# Patient Record
Sex: Male | Born: 1950
Health system: Southern US, Community
[De-identification: ages and names within clinical notes are randomized; demographics above are authoritative.]

## PROBLEM LIST (undated history)

## (undated) DIAGNOSIS — I340 Nonrheumatic mitral (valve) insufficiency: Secondary | ICD-10-CM

## (undated) DIAGNOSIS — E782 Mixed hyperlipidemia: Secondary | ICD-10-CM

## (undated) DIAGNOSIS — I10 Essential (primary) hypertension: Secondary | ICD-10-CM

## (undated) DIAGNOSIS — J302 Other seasonal allergic rhinitis: Secondary | ICD-10-CM

## (undated) DIAGNOSIS — Z9889 Other specified postprocedural states: Secondary | ICD-10-CM

## (undated) DIAGNOSIS — M87052 Idiopathic aseptic necrosis of left femur: Secondary | ICD-10-CM

## (undated) DIAGNOSIS — Z973 Presence of spectacles and contact lenses: Secondary | ICD-10-CM

## (undated) DIAGNOSIS — Z720 Tobacco use: Secondary | ICD-10-CM

## (undated) DIAGNOSIS — K219 Gastro-esophageal reflux disease without esophagitis: Secondary | ICD-10-CM

## (undated) DIAGNOSIS — R002 Palpitations: Secondary | ICD-10-CM

## (undated) DIAGNOSIS — F419 Anxiety disorder, unspecified: Secondary | ICD-10-CM

## (undated) HISTORY — PX: BACK SURGERY: SHX140

## (undated) HISTORY — DX: Mixed hyperlipidemia: E78.2

## (undated) HISTORY — DX: Idiopathic aseptic necrosis of left femur: M87.052

## (undated) HISTORY — DX: Nonrheumatic mitral (valve) insufficiency: I34.0

## (undated) HISTORY — DX: Palpitations: R00.2

## (undated) HISTORY — DX: Other specified postprocedural states: Z98.890

## (undated) HISTORY — DX: Essential (primary) hypertension: I10

## (undated) HISTORY — DX: Other seasonal allergic rhinitis: J30.2

## (undated) HISTORY — DX: Gastro-esophageal reflux disease without esophagitis: K21.9

## (undated) HISTORY — PX: NOSE SURGERY: SHX723

## (undated) HISTORY — DX: Tobacco use: Z72.0

## (undated) HISTORY — DX: Anxiety disorder, unspecified: F41.9

---

## 1999-03-30 ENCOUNTER — Inpatient Hospital Stay (HOSPITAL_COMMUNITY): Admission: EM | Admit: 1999-03-30 | Discharge: 1999-03-31 | Payer: Self-pay | Admitting: Cardiology

## 2007-09-19 ENCOUNTER — Encounter: Payer: Self-pay | Admitting: Physician Assistant

## 2008-01-04 ENCOUNTER — Encounter: Payer: Self-pay | Admitting: Physician Assistant

## 2008-01-04 LAB — PULMONARY FUNCTION TEST

## 2009-03-05 ENCOUNTER — Encounter: Payer: Self-pay | Admitting: Physician Assistant

## 2012-01-06 ENCOUNTER — Other Ambulatory Visit: Payer: Self-pay | Admitting: Diagnostic Neuroimaging

## 2012-01-06 DIAGNOSIS — M79609 Pain in unspecified limb: Secondary | ICD-10-CM

## 2012-01-06 DIAGNOSIS — R209 Unspecified disturbances of skin sensation: Secondary | ICD-10-CM

## 2012-01-18 ENCOUNTER — Encounter: Payer: Self-pay | Admitting: Physician Assistant

## 2012-03-07 ENCOUNTER — Encounter: Payer: Self-pay | Admitting: *Deleted

## 2012-03-12 ENCOUNTER — Ambulatory Visit (INDEPENDENT_AMBULATORY_CARE_PROVIDER_SITE_OTHER): Payer: Medicare Other | Admitting: Cardiology

## 2012-03-12 ENCOUNTER — Encounter: Payer: Self-pay | Admitting: Cardiology

## 2012-03-12 VITALS — BP 150/87 | HR 70 | Ht 69.0 in | Wt 168.0 lb

## 2012-03-12 DIAGNOSIS — Z01818 Encounter for other preprocedural examination: Secondary | ICD-10-CM

## 2012-03-12 DIAGNOSIS — Z720 Tobacco use: Secondary | ICD-10-CM | POA: Insufficient documentation

## 2012-03-12 DIAGNOSIS — E782 Mixed hyperlipidemia: Secondary | ICD-10-CM

## 2012-03-12 DIAGNOSIS — R011 Cardiac murmur, unspecified: Secondary | ICD-10-CM

## 2012-03-12 DIAGNOSIS — F17200 Nicotine dependence, unspecified, uncomplicated: Secondary | ICD-10-CM

## 2012-03-12 DIAGNOSIS — I10 Essential (primary) hypertension: Secondary | ICD-10-CM

## 2012-03-12 DIAGNOSIS — Z136 Encounter for screening for cardiovascular disorders: Secondary | ICD-10-CM

## 2012-03-12 NOTE — Assessment & Plan Note (Signed)
Smoking cessation discussed 

## 2012-03-12 NOTE — Assessment & Plan Note (Signed)
Suspect benign, echocardiogram to be ordered for further evaluation.

## 2012-03-12 NOTE — Patient Instructions (Signed)
Your follow up will be based on your test results.  Your physician has requested that you have an echocardiogram. Echocardiography is a painless test that uses sound waves to create images of your heart. It provides your doctor with information about the size and shape of your heart and how well your heart's chambers and valves are working. This procedure takes approximately one hour. There are no restrictions for this procedure. If the results of your test are normal or stable, you will receive a letter. If they are abnormal, the nurse will contact you by phone.

## 2012-03-12 NOTE — Progress Notes (Signed)
Clinical Summary [name] is a 61 y.o.male referred for cardiology consultation by Dr. Thurston Hole prior to elective left hip replacement, reportedly sometime in June. He is here with his wife today.  History as outlined below. He reports no definite history of CAD status post cardiac catheterization in 2000. Reportedly saw Dr. Myrtis Ser in the past. He does not indicate any active angina, has chronic dyspnea on exertion, NYHA class 2-3 in the setting of ongoing tobacco abuse and reported COPD. This has not progressed.  Followup ECG is reviewed below, normal. He states he is able to go up a flight of steps without stopping, main functional limitation is related to hip pain. He uses a cane.  Also reports long-standing history of heart murmur, does not recall a prior echocardiogram.   Allergies  Allergen Reactions  . Doxycycline   . Penicillins     Current Outpatient Prescriptions  Medication Sig Dispense Refill  . albuterol (PROAIR HFA) 108 (90 BASE) MCG/ACT inhaler Inhale 2 puffs into the lungs every 6 (six) hours as needed.      Marland Kitchen atenolol (TENORMIN) 50 MG tablet Take 1 tablet by mouth Daily.      . cetirizine (ZYRTEC) 10 MG tablet Take 10 mg by mouth daily.      . cyanocobalamin (,VITAMIN B-12,) 1000 MCG/ML injection Inject 1,000 mcg into the muscle every 30 (thirty) days.      . diazepam (VALIUM) 10 MG tablet Take 10 mg by mouth every 6 (six) hours as needed.      Marland Kitchen esomeprazole (NEXIUM) 40 MG capsule Take 40 mg by mouth 2 (two) times daily.       Marland Kitchen lovastatin (MEVACOR) 20 MG tablet Take 20 mg by mouth at bedtime.      Marland Kitchen oxyCODONE-acetaminophen (PERCOCET) 10-325 MG per tablet Take 1 tablet by mouth 4 times daily as needed.        Past Medical History  Diagnosis Date  . Essential hypertension, benign   . Anxiety disorder   . Palpitations   . Seasonal allergies   . GERD (gastroesophageal reflux disease)   . Mixed hyperlipidemia   . History of cardiac catheterization     [place]  2000 - no reported significant CAD    Past Surgical History  Procedure Date  . Back surgery     May 2012  . Nose surgery     1988    Family History  Problem Relation Age of Onset  . Heart failure Mother     Social History [name] reports that he has been smoking Cigarettes.  He has a 25 pack-year smoking history. He has never used smokeless tobacco. [name] reports that he does not drink alcohol.  Review of Systems Has occasional palpitations, reports anxiety. No dizziness or syncope. No reported bleeding problems. Does indicate prior history of peptic ulcer disease. Stable appetite. No orthopnea or PND. Otherwise negative.  Physical Examination Filed Vitals:   03/12/12 0959  BP: 150/87  Pulse: 70   Overweight male in no acute distress. HEENT: Conjunctiva and lids normal, oropharynx clear. Neck: Supple, no elevated JVP or carotid bruits, no thyromegaly. Lungs: Diminished breath sounds, clear to auscultation, nonlabored breathing at rest. Cardiac: Regular rate and rhythm, no S3, 2/6 basal systolic murmur, no pericardial rub. Abdomen: Soft, nontender, bowel sounds present, no guarding or rebound. Extremities: No pitting edema, distal pulses 2+. Skin: Warm and dry. Musculoskeletal: No kyphosis. Neuropsychiatric: Alert and oriented x3, affect grossly appropriate.   ECG Normal  sinus rhythm at 70.    Problem List and Plan

## 2012-03-12 NOTE — Assessment & Plan Note (Signed)
Patient being considered for elective left hip replacement. Cardiac risk factors reviewed as well as prior reported reassuring testing. He does not indicate any angina, ECG is normal, and he has stable dyspnea on exertion with reasonable functional capacity from a cardiac perspective in the setting of orthopedic functional limitations. He does have a soft heart murmur, doubt that this is of major clinical significance, however has never had a prior echocardiogram. This will be arranged, and if he has normal LV function with no major valvular abnormalities, he should be able to proceed with planned surgery at an acceptable perioperative cardiac risk. Continue beta blocker therapy throughout, monitor for any arrhythmias.

## 2012-03-12 NOTE — Assessment & Plan Note (Signed)
Continue present regimen and follow up with Dr. Sherril Croon.

## 2012-03-12 NOTE — Assessment & Plan Note (Signed)
On statin therapy, followed by Dr. Vyas. 

## 2012-03-21 ENCOUNTER — Other Ambulatory Visit (INDEPENDENT_AMBULATORY_CARE_PROVIDER_SITE_OTHER): Payer: Medicare Other

## 2012-03-21 ENCOUNTER — Other Ambulatory Visit: Payer: Self-pay

## 2012-03-21 DIAGNOSIS — R011 Cardiac murmur, unspecified: Secondary | ICD-10-CM

## 2012-03-21 DIAGNOSIS — I059 Rheumatic mitral valve disease, unspecified: Secondary | ICD-10-CM

## 2012-03-21 DIAGNOSIS — Z136 Encounter for screening for cardiovascular disorders: Secondary | ICD-10-CM

## 2012-03-27 ENCOUNTER — Telehealth: Payer: Self-pay | Admitting: *Deleted

## 2012-03-27 ENCOUNTER — Encounter: Payer: Self-pay | Admitting: *Deleted

## 2012-03-27 NOTE — Telephone Encounter (Signed)
Pt notified of results and verbalized understanding  

## 2012-03-27 NOTE — Telephone Encounter (Signed)
Left message to call back on voicemail regarding results.   Report and OV note faxed to Dr. Thurston Hole.

## 2012-03-27 NOTE — Telephone Encounter (Signed)
Message copied by Arlyss Gandy on Tue Mar 27, 2012 11:23 AM ------      Message from: MCDOWELL, Illene Bolus      Created: Wed Mar 21, 2012  1:17 PM       Reviewed report. LVEF is normal at 55-60%. Has some aortic valve thickening but no stenosis. Please send copy of this report and also recent office note to Dr. Thurston Hole. Patient should be able to proceed with planned surgery as noted.

## 2012-07-05 ENCOUNTER — Encounter (HOSPITAL_COMMUNITY): Payer: Self-pay | Admitting: Pharmacy Technician

## 2012-07-16 ENCOUNTER — Other Ambulatory Visit (HOSPITAL_COMMUNITY): Payer: Medicare Other

## 2012-07-23 ENCOUNTER — Encounter (HOSPITAL_COMMUNITY): Admission: RE | Payer: Self-pay | Source: Ambulatory Visit

## 2012-07-23 ENCOUNTER — Ambulatory Visit (HOSPITAL_COMMUNITY): Admission: RE | Admit: 2012-07-23 | Payer: Medicare Other | Source: Ambulatory Visit | Admitting: Orthopedic Surgery

## 2012-07-23 SURGERY — ARTHROPLASTY, HIP, TOTAL,POSTERIOR APPROACH
Anesthesia: General | Laterality: Left

## 2013-04-23 ENCOUNTER — Other Ambulatory Visit: Payer: Self-pay | Admitting: Physician Assistant

## 2013-04-23 ENCOUNTER — Encounter: Payer: Self-pay | Admitting: Physician Assistant

## 2013-04-23 DIAGNOSIS — Z9889 Other specified postprocedural states: Secondary | ICD-10-CM | POA: Insufficient documentation

## 2013-04-23 DIAGNOSIS — M87052 Idiopathic aseptic necrosis of left femur: Secondary | ICD-10-CM

## 2013-04-23 DIAGNOSIS — J302 Other seasonal allergic rhinitis: Secondary | ICD-10-CM

## 2013-04-23 DIAGNOSIS — K219 Gastro-esophageal reflux disease without esophagitis: Secondary | ICD-10-CM

## 2013-04-23 DIAGNOSIS — R002 Palpitations: Secondary | ICD-10-CM | POA: Insufficient documentation

## 2013-04-23 DIAGNOSIS — F419 Anxiety disorder, unspecified: Secondary | ICD-10-CM | POA: Insufficient documentation

## 2013-04-29 ENCOUNTER — Encounter (HOSPITAL_COMMUNITY): Admission: RE | Admit: 2013-04-29 | Payer: Medicare Other | Source: Ambulatory Visit

## 2013-05-08 ENCOUNTER — Inpatient Hospital Stay (HOSPITAL_COMMUNITY): Admission: RE | Admit: 2013-05-08 | Payer: Medicare Other | Source: Ambulatory Visit | Admitting: Orthopedic Surgery

## 2013-05-08 ENCOUNTER — Encounter (HOSPITAL_COMMUNITY): Admission: RE | Payer: Self-pay | Source: Ambulatory Visit

## 2013-05-08 SURGERY — ARTHROPLASTY, HIP, TOTAL,POSTERIOR APPROACH
Anesthesia: General | Laterality: Left

## 2013-05-30 ENCOUNTER — Ambulatory Visit: Payer: Medicare Other | Admitting: Physician Assistant

## 2013-06-12 ENCOUNTER — Ambulatory Visit: Payer: Medicare Other | Admitting: Physician Assistant

## 2013-06-18 ENCOUNTER — Encounter (HOSPITAL_COMMUNITY): Payer: Self-pay | Admitting: Pharmacy Technician

## 2013-06-19 ENCOUNTER — Other Ambulatory Visit: Payer: Self-pay | Admitting: Physician Assistant

## 2013-06-19 NOTE — H&P (Signed)
TOTAL HIP ADMISSION H&P  Patient is admitted for left total hip arthroplasty.  Subjective:  Chief Complaint: left hip pain  HPI: Todd Hodge, 62 y.o. male, has a history of pain and functional disability in the left hip(s) due to arthritis and patient has failed non-surgical conservative treatments for greater than 12 weeks to include NSAID's and/or analgesics, corticosteriod injections, flexibility and strengthening excercises, supervised PT with diminished ADL's post treatment, use of assistive devices, weight reduction as appropriate and activity modification.  Onset of symptoms was gradual starting 8 years ago with gradually worsening course since that time.The patient noted no past surgery on the left hip(s).  Patient currently rates pain in the left hip at 8 out of 10 with activity. Patient has night pain, worsening of pain with activity and weight bearing, trendelenberg gait, pain that interfers with activities of daily living and joint swelling. Patient has evidence of subchondral cysts, subchondral sclerosis, periarticular osteophytes and joint space narrowing by imaging studies. This condition presents safety issues increasing the risk of falls. MRI shows AVN of both hips  There is no current active infection.  Patient Active Problem List   Diagnosis Date Noted  . Anxiety disorder   . Palpitations   . Seasonal allergies   . GERD (gastroesophageal reflux disease)   . History of cardiac catheterization   . Avascular necrosis of femur head, left   . Preoperative evaluation to rule out surgical contraindication 03/12/2012  . Cardiac murmur 03/12/2012  . Essential hypertension, benign 03/12/2012  . Tobacco abuse 03/12/2012  . Mixed hyperlipidemia 03/12/2012   Past Medical History  Diagnosis Date  . Essential hypertension, benign   . Anxiety disorder   . Palpitations   . Seasonal allergies   . GERD (gastroesophageal reflux disease)   . Mixed hyperlipidemia   . History of  cardiac catheterization     [place] 2000 - no reported significant CAD  . Avascular necrosis of femur head, left     Past Surgical History  Procedure Laterality Date  . Back surgery      May 2012  . Nose surgery      1988     (Not in a hospital admission) Allergies  Allergen Reactions  . Penicillins   . Doxycycline Rash    History  Substance Use Topics  . Smoking status: Current Every Day Smoker -- 1.00 packs/day for 25 years    Types: Cigarettes  . Smokeless tobacco: Never Used  . Alcohol Use: No    Family History  Problem Relation Age of Onset  . Heart failure Mother      Review of Systems  Constitutional: Negative.   HENT: Negative.   Eyes: Negative.   Respiratory: Positive for shortness of breath.   Gastrointestinal: Negative.   Genitourinary: Negative.   Musculoskeletal: Positive for back pain and joint pain.  Skin: Negative.   Neurological: Negative.   Endo/Heme/Allergies: Negative.   Psychiatric/Behavioral: Negative.     Objective:  Physical Exam  Constitutional: He is oriented to person, place, and time. He appears well-developed and well-nourished.  HENT:  Head: Normocephalic.  Eyes: Conjunctivae are normal. Pupils are equal, round, and reactive to light.  Neck: Neck supple.  Cardiovascular: Normal rate and regular rhythm.   Murmur heard. Respiratory: Effort normal. He has wheezes.  GI: Soft. Bowel sounds are normal.  Genitourinary:  Not pertinent to current symptomatology therefore not examined.  Musculoskeletal:  Well developed well nourished white male in no acute distress. Alert and oriented.  Examination of his left hip reveals severe groin pain, flexion of 90 extension to 0, internal and external rotation of 10 degrees with pain. Exam of the right hip reveals full range of motion with only mild pain. Vascular exam: pulses 2+ and symmetric.  Neurological: He is alert and oriented to person, place, and time.  Skin: Skin is warm and dry.   Psychiatric: He has a normal mood and affect. His behavior is normal.    Vital signs in last 24 hours: Last recorded: 07/23 1500   BP: 166/94 Pulse: 60  Temp: 98.1 F (36.7 C)    Height: 5\' 10"  (1.778 m) SpO2: 98  Weight: 73.936 kg (163 lb)     Labs:   Estimated body mass index is 23.39 kg/(m^2) as calculated from the following:   Height as of this encounter: 5\' 10"  (1.778 m).   Weight as of this encounter: 73.936 kg (163 lb).   Imaging Review Plain radiographs demonstrate severe degenerative joint disease of the left hip(s). The bone quality appears to be good for age and reported activity level.  Assessment/Plan:  End stage arthritis, left hip(s)  The patient history, physical examination, clinical judgement of the provider and imaging studies are consistent with end stage degenerative joint disease of the left hip(s) and total hip arthroplasty is deemed medically necessary. The treatment options including medical management, injection therapy, arthroscopy and arthroplasty were discussed at length. The risks and benefits of total hip arthroplasty were presented and reviewed. The risks due to aseptic loosening, infection, stiffness, dislocation/subluxation,  thromboembolic complications and other imponderables were discussed.  The patient acknowledged the explanation, agreed to proceed with the plan and consent was signed. Patient is being admitted for inpatient treatment for surgery, pain control, PT, OT, prophylactic antibiotics, VTE prophylaxis, progressive ambulation and ADL's and discharge planning.The patient is planning to be discharged home with home health services  Sanjna Haskew A. Gwinda Passe Physician Assistant [facility] [phone]  06/19/2013, 4:34 PM

## 2013-06-25 ENCOUNTER — Encounter (HOSPITAL_COMMUNITY): Admission: RE | Admit: 2013-06-25 | Payer: Medicare Other | Source: Ambulatory Visit

## 2013-06-28 ENCOUNTER — Ambulatory Visit (INDEPENDENT_AMBULATORY_CARE_PROVIDER_SITE_OTHER): Payer: Medicare Other | Admitting: Physician Assistant

## 2013-06-28 ENCOUNTER — Encounter: Payer: Self-pay | Admitting: Physician Assistant

## 2013-06-28 VITALS — BP 123/77 | HR 66 | Ht 70.0 in | Wt 162.8 lb

## 2013-06-28 DIAGNOSIS — R002 Palpitations: Secondary | ICD-10-CM

## 2013-06-28 DIAGNOSIS — I059 Rheumatic mitral valve disease, unspecified: Secondary | ICD-10-CM

## 2013-06-28 DIAGNOSIS — Z01818 Encounter for other preprocedural examination: Secondary | ICD-10-CM

## 2013-06-28 DIAGNOSIS — I340 Nonrheumatic mitral (valve) insufficiency: Secondary | ICD-10-CM

## 2013-06-28 DIAGNOSIS — F17200 Nicotine dependence, unspecified, uncomplicated: Secondary | ICD-10-CM

## 2013-06-28 DIAGNOSIS — Z720 Tobacco use: Secondary | ICD-10-CM

## 2013-06-28 MED ORDER — ASPIRIN EC 81 MG PO TBEC: 81.0000 mg | DELAYED_RELEASE_TABLET | Freq: Every day | ORAL | Status: AC

## 2013-06-28 NOTE — Assessment & Plan Note (Signed)
Patient was strongly advised to stop smoking altogether.

## 2013-06-28 NOTE — Assessment & Plan Note (Signed)
In the absence of any interim development of symptoms suggestive of angina, a repeat NL EKG today, and with normal LVF by echocardiography at time of last OV, patient is once again cleared to proceed with scheduled hip surgery, as planned. He is deemed to be at an acceptable risk for perioperative events, from a cardiac standpoint. We will, however, resume antiplatelet therapy with ASA 81 mg daily, which he had been on in the past. He is to continue on beta blocker therapy, as well.

## 2013-06-28 NOTE — Assessment & Plan Note (Signed)
Assessed as mild, by echocardiogram in April 2013. Continue to monitor clinically.

## 2013-06-28 NOTE — Progress Notes (Signed)
Primary Cardiologist: Simona Huh, MD   HPI: Patient presents for preoperative cardiac clearance, last seen here in clinic in April 2013, by Dr. Diona Browner. Of note, he presented at that time for preoperative cardiac clearance as well. He was referred for an echocardiogram, absent history of significant CAD or presentation with symptoms suggestive of angina pectoris.   - Echocardiogram, 02/2012: EF 55-60%; no focal WMAs; mild MR  He returns today stating that he had to postpone his left hip surgery scheduled for last year, secondary to epidural treatment for recurrent lower back pain, extraction of a possibly infected molar, and current antiviral therapy for herpes zoster. He states that he has to be off Acyclovir for 6-8 weeks, before undergoing hip surgery. Consequently, this has been scheduled for October 6, at [facility], with Dr. Salvatore Marvel.   From a cardiac standpoint, however, he denies any interim development of chest pain, either at rest or with exertion. He also reports no significant change from his baseline exercise tolerance level, although he is quite limited secondary to severe left hip pain.   Twelve-lead EKG today, reviewed by me, indicates NSR 66 bpm; NL axis; no ischemic changes  Allergies  Allergen Reactions  . Penicillins   . Doxycycline Rash    Current Outpatient Prescriptions  Medication Sig Dispense Refill  . albuterol (PROAIR HFA) 108 (90 BASE) MCG/ACT inhaler Inhale 2 puffs into the lungs every 6 (six) hours as needed. For COPD symptoms      . atenolol (TENORMIN) 50 MG tablet Take 1 tablet by mouth Daily.      . cetirizine (ZYRTEC) 10 MG tablet Take 10 mg by mouth at bedtime.       . diazepam (VALIUM) 10 MG tablet Take 10 mg by mouth 4 (four) times daily. For anxiety      . esomeprazole (NEXIUM) 40 MG capsule Take 40 mg by mouth 2 (two) times daily.       Marland Kitchen lovastatin (MEVACOR) 20 MG tablet Take 20 mg by mouth at bedtime.      Marland Kitchen oxyCODONE-acetaminophen  (PERCOCET) 10-325 MG per tablet Take 1 tablet by mouth 4 times daily as needed. For pain      . vitamin B-12 (CYANOCOBALAMIN) 1000 MCG tablet Take 1,000 mcg by mouth daily.      Marland Kitchen aspirin EC 81 MG tablet Take 1 tablet (81 mg total) by mouth daily.       No current facility-administered medications for this visit.    Past Medical History  Diagnosis Date  . Essential hypertension, benign   . Anxiety disorder   . Palpitations   . Seasonal allergies   . GERD (gastroesophageal reflux disease)   . Mixed hyperlipidemia   . History of cardiac catheterization     [place] 2000 - no reported significant CAD  . Avascular necrosis of femur head, left   . Mitral regurgitation     Mild, echo, 02/2012  . Tobacco abuse     Past Surgical History  Procedure Laterality Date  . Back surgery      May 2012  . Nose surgery      1988    History   Social History  . Marital Status: Married    Spouse Name: N/A    Number of Children: N/A  . Years of Education: N/A   Occupational History  . Disabled     Sales   Social History Main Topics  . Smoking status: Current Every Day Smoker -- 1.00 packs/day  for 25 years    Types: Cigarettes  . Smokeless tobacco: Never Used  . Alcohol Use: No  . Drug Use: No  . Sexually Active: Not on file   Other Topics Concern  . Not on file   Social History Narrative  . No narrative on file    Family History  Problem Relation Age of Onset  . Heart failure Mother     ROS: no nausea, vomiting; no fever, chills; no melena, hematochezia; no claudication  PHYSICAL EXAM: BP 123/77  Pulse 66  Ht 5\' 10"  (1.778 m)  Wt 162 lb 12.8 oz (73.846 kg)  BMI 23.36 kg/m2 GENERAL: 62 year old male; NAD HEENT: NCAT, PERRLA, EOMI; sclera clear; no xanthelasma NECK: palpable bilateral carotid pulses, no bruits; no JVD; no TM LUNGS: CTA bilaterally CARDIAC: RRR (S1, S2); no significant murmurs; no rubs or gallops ABDOMEN: soft, non-tender; intact BS EXTREMETIES: no  significant peripheral edema SKIN: warm/dry; no obvious rash/lesions MUSCULOSKELETAL: no joint deformity NEURO: no focal deficit; NL affect   EKG: reviewed and available in Electronic Records   ASSESSMENT & PLAN:  Preoperative evaluation to rule out surgical contraindication In the absence of any interim development of symptoms suggestive of angina, a repeat NL EKG today, and with normal LVF by echocardiography at time of last OV, patient is once again cleared to proceed with scheduled hip surgery, as planned. He is deemed to be at an acceptable risk for perioperative events, from a cardiac standpoint. We will, however, resume antiplatelet therapy with ASA 81 mg daily, which he had been on in the past. He is to continue on beta blocker therapy, as well.  Mitral regurgitation Assessed as mild, by echocardiogram in April 2013. Continue to monitor clinically.  Tobacco abuse Patient was strongly advised to stop smoking altogether.    Gene Charlaine Utsey, PAC

## 2013-06-28 NOTE — Patient Instructions (Signed)
   Begin Aspirin 81mg  daily Continue all other current medications. Your physician wants you to follow up in:  1 year.  You will receive a reminder letter in the mail one-two months in advance.  If you don't receive a letter, please call our office to schedule the follow up appointment

## 2013-07-01 ENCOUNTER — Encounter (HOSPITAL_COMMUNITY): Admission: RE | Payer: Self-pay | Source: Ambulatory Visit

## 2013-07-01 ENCOUNTER — Inpatient Hospital Stay (HOSPITAL_COMMUNITY): Admission: RE | Admit: 2013-07-01 | Payer: Medicare Other | Source: Ambulatory Visit | Admitting: Orthopedic Surgery

## 2013-07-01 SURGERY — ARTHROPLASTY, HIP, TOTAL,POSTERIOR APPROACH
Anesthesia: General | Laterality: Left

## 2013-08-23 ENCOUNTER — Other Ambulatory Visit (HOSPITAL_COMMUNITY): Payer: Medicare Other

## 2013-09-02 ENCOUNTER — Inpatient Hospital Stay: Admit: 2013-09-02 | Payer: Medicare Other | Admitting: Orthopedic Surgery

## 2013-09-02 SURGERY — ARTHROPLASTY, HIP, TOTAL,POSTERIOR APPROACH
Anesthesia: General | Site: Hip | Laterality: Left

## 2013-09-10 ENCOUNTER — Other Ambulatory Visit (HOSPITAL_COMMUNITY): Payer: Self-pay | Admitting: Neurosurgery

## 2013-09-10 DIAGNOSIS — IMO0002 Reserved for concepts with insufficient information to code with codable children: Secondary | ICD-10-CM

## 2013-09-17 ENCOUNTER — Ambulatory Visit (HOSPITAL_COMMUNITY)
Admission: RE | Admit: 2013-09-17 | Discharge: 2013-09-17 | Disposition: A | Discharge status: Home / Self Care | Payer: Medicare Other | Source: Ambulatory Visit | Attending: Neurosurgery | Admitting: Neurosurgery

## 2013-09-17 DIAGNOSIS — IMO0002 Reserved for concepts with insufficient information to code with codable children: Secondary | ICD-10-CM

## 2013-09-17 DIAGNOSIS — M79609 Pain in unspecified limb: Secondary | ICD-10-CM | POA: Insufficient documentation

## 2013-09-17 DIAGNOSIS — M549 Dorsalgia, unspecified: Secondary | ICD-10-CM | POA: Insufficient documentation

## 2013-09-17 LAB — CREATININE, SERUM
Creatinine, Ser: 1.12 mg/dL (ref 0.50–1.35)
GFR calc Af Amer: 78 mL/min — ABNORMAL LOW (ref >90)
GFR calc non Af Amer: 68 mL/min — ABNORMAL LOW (ref >90)

## 2013-09-17 MED ORDER — GADOBENATE DIMEGLUMINE 529 MG/ML IV SOLN
15.0000 mL | Freq: Once | INTRAVENOUS | Status: AC
  Administered 2013-09-17: 15 mL via INTRAVENOUS

## 2013-09-20 ENCOUNTER — Other Ambulatory Visit: Payer: Self-pay | Admitting: Neurosurgery

## 2013-10-14 ENCOUNTER — Inpatient Hospital Stay (HOSPITAL_COMMUNITY): Admission: RE | Admit: 2013-10-14 | Payer: Medicare Other | Source: Ambulatory Visit | Admitting: Neurosurgery

## 2013-10-14 ENCOUNTER — Encounter (HOSPITAL_COMMUNITY): Admission: RE | Payer: Self-pay | Source: Ambulatory Visit

## 2013-10-14 SURGERY — POSTERIOR LUMBAR FUSION 1 LEVEL
Anesthesia: General | Site: Back

## 2015-12-04 DIAGNOSIS — Z418 Encounter for other procedures for purposes other than remedying health state: Secondary | ICD-10-CM | POA: Diagnosis not present

## 2015-12-04 DIAGNOSIS — J441 Chronic obstructive pulmonary disease with (acute) exacerbation: Secondary | ICD-10-CM | POA: Diagnosis not present

## 2015-12-04 DIAGNOSIS — I10 Essential (primary) hypertension: Secondary | ICD-10-CM | POA: Diagnosis not present

## 2015-12-04 DIAGNOSIS — Z720 Tobacco use: Secondary | ICD-10-CM | POA: Diagnosis not present

## 2015-12-10 DIAGNOSIS — F411 Generalized anxiety disorder: Secondary | ICD-10-CM | POA: Diagnosis not present

## 2015-12-10 DIAGNOSIS — E039 Hypothyroidism, unspecified: Secondary | ICD-10-CM | POA: Diagnosis not present

## 2015-12-10 DIAGNOSIS — Z79899 Other long term (current) drug therapy: Secondary | ICD-10-CM | POA: Diagnosis not present

## 2015-12-10 DIAGNOSIS — N400 Enlarged prostate without lower urinary tract symptoms: Secondary | ICD-10-CM | POA: Diagnosis not present

## 2015-12-10 DIAGNOSIS — E7800 Pure hypercholesterolemia, unspecified: Secondary | ICD-10-CM | POA: Diagnosis not present

## 2016-01-05 DIAGNOSIS — M25552 Pain in left hip: Secondary | ICD-10-CM | POA: Diagnosis not present

## 2016-01-05 DIAGNOSIS — N183 Chronic kidney disease, stage 3 (moderate): Secondary | ICD-10-CM | POA: Diagnosis not present

## 2016-01-07 DIAGNOSIS — M25552 Pain in left hip: Secondary | ICD-10-CM | POA: Diagnosis not present

## 2016-01-27 DIAGNOSIS — M545 Low back pain: Secondary | ICD-10-CM | POA: Diagnosis not present

## 2016-01-27 DIAGNOSIS — E7800 Pure hypercholesterolemia, unspecified: Secondary | ICD-10-CM | POA: Diagnosis not present

## 2016-01-27 DIAGNOSIS — I10 Essential (primary) hypertension: Secondary | ICD-10-CM | POA: Diagnosis not present

## 2016-03-28 DIAGNOSIS — E039 Hypothyroidism, unspecified: Secondary | ICD-10-CM | POA: Diagnosis not present

## 2016-03-28 DIAGNOSIS — F411 Generalized anxiety disorder: Secondary | ICD-10-CM | POA: Diagnosis not present

## 2016-03-28 DIAGNOSIS — J449 Chronic obstructive pulmonary disease, unspecified: Secondary | ICD-10-CM | POA: Diagnosis not present

## 2016-03-28 DIAGNOSIS — M545 Low back pain: Secondary | ICD-10-CM | POA: Diagnosis not present

## 2016-04-26 DIAGNOSIS — I10 Essential (primary) hypertension: Secondary | ICD-10-CM | POA: Diagnosis not present

## 2016-04-26 DIAGNOSIS — E7800 Pure hypercholesterolemia, unspecified: Secondary | ICD-10-CM | POA: Diagnosis not present

## 2016-04-26 DIAGNOSIS — J449 Chronic obstructive pulmonary disease, unspecified: Secondary | ICD-10-CM | POA: Diagnosis not present

## 2016-04-26 DIAGNOSIS — M159 Polyosteoarthritis, unspecified: Secondary | ICD-10-CM | POA: Diagnosis not present

## 2016-05-27 DIAGNOSIS — N183 Chronic kidney disease, stage 3 (moderate): Secondary | ICD-10-CM | POA: Diagnosis not present

## 2016-05-27 DIAGNOSIS — Z299 Encounter for prophylactic measures, unspecified: Secondary | ICD-10-CM | POA: Diagnosis not present

## 2016-05-27 DIAGNOSIS — J441 Chronic obstructive pulmonary disease with (acute) exacerbation: Secondary | ICD-10-CM | POA: Diagnosis not present

## 2016-05-27 DIAGNOSIS — M545 Low back pain: Secondary | ICD-10-CM | POA: Diagnosis not present

## 2016-07-27 DIAGNOSIS — Z299 Encounter for prophylactic measures, unspecified: Secondary | ICD-10-CM | POA: Diagnosis not present

## 2016-07-27 DIAGNOSIS — M25559 Pain in unspecified hip: Secondary | ICD-10-CM | POA: Diagnosis not present

## 2016-07-27 DIAGNOSIS — J449 Chronic obstructive pulmonary disease, unspecified: Secondary | ICD-10-CM | POA: Diagnosis not present

## 2016-07-27 DIAGNOSIS — F411 Generalized anxiety disorder: Secondary | ICD-10-CM | POA: Diagnosis not present

## 2016-08-09 DIAGNOSIS — L820 Inflamed seborrheic keratosis: Secondary | ICD-10-CM | POA: Diagnosis not present

## 2016-08-09 DIAGNOSIS — L821 Other seborrheic keratosis: Secondary | ICD-10-CM | POA: Diagnosis not present

## 2016-08-09 DIAGNOSIS — D485 Neoplasm of uncertain behavior of skin: Secondary | ICD-10-CM | POA: Diagnosis not present

## 2016-09-15 DIAGNOSIS — E7800 Pure hypercholesterolemia, unspecified: Secondary | ICD-10-CM | POA: Diagnosis not present

## 2016-09-15 DIAGNOSIS — M159 Polyosteoarthritis, unspecified: Secondary | ICD-10-CM | POA: Diagnosis not present

## 2016-09-15 DIAGNOSIS — I10 Essential (primary) hypertension: Secondary | ICD-10-CM | POA: Diagnosis not present

## 2016-09-15 DIAGNOSIS — J449 Chronic obstructive pulmonary disease, unspecified: Secondary | ICD-10-CM | POA: Diagnosis not present

## 2016-09-26 DIAGNOSIS — M545 Low back pain: Secondary | ICD-10-CM | POA: Diagnosis not present

## 2016-09-26 DIAGNOSIS — Z299 Encounter for prophylactic measures, unspecified: Secondary | ICD-10-CM | POA: Diagnosis not present

## 2016-09-26 DIAGNOSIS — M25559 Pain in unspecified hip: Secondary | ICD-10-CM | POA: Diagnosis not present

## 2016-09-26 DIAGNOSIS — R109 Unspecified abdominal pain: Secondary | ICD-10-CM | POA: Diagnosis not present

## 2016-09-26 DIAGNOSIS — R140 Abdominal distension (gaseous): Secondary | ICD-10-CM | POA: Diagnosis not present

## 2016-09-26 DIAGNOSIS — K219 Gastro-esophageal reflux disease without esophagitis: Secondary | ICD-10-CM | POA: Diagnosis not present

## 2016-09-28 HISTORY — PX: COLONOSCOPY: SHX174

## 2016-09-29 DIAGNOSIS — J449 Chronic obstructive pulmonary disease, unspecified: Secondary | ICD-10-CM | POA: Diagnosis not present

## 2016-09-29 DIAGNOSIS — Z880 Allergy status to penicillin: Secondary | ICD-10-CM | POA: Diagnosis not present

## 2016-09-29 DIAGNOSIS — D125 Benign neoplasm of sigmoid colon: Secondary | ICD-10-CM | POA: Diagnosis not present

## 2016-09-29 DIAGNOSIS — F17210 Nicotine dependence, cigarettes, uncomplicated: Secondary | ICD-10-CM | POA: Diagnosis not present

## 2016-09-29 DIAGNOSIS — Z8673 Personal history of transient ischemic attack (TIA), and cerebral infarction without residual deficits: Secondary | ICD-10-CM | POA: Diagnosis not present

## 2016-09-29 DIAGNOSIS — F419 Anxiety disorder, unspecified: Secondary | ICD-10-CM | POA: Diagnosis not present

## 2016-09-29 DIAGNOSIS — D126 Benign neoplasm of colon, unspecified: Secondary | ICD-10-CM | POA: Diagnosis not present

## 2016-09-29 DIAGNOSIS — Z1211 Encounter for screening for malignant neoplasm of colon: Secondary | ICD-10-CM | POA: Diagnosis not present

## 2016-09-29 DIAGNOSIS — K219 Gastro-esophageal reflux disease without esophagitis: Secondary | ICD-10-CM | POA: Diagnosis not present

## 2016-09-29 DIAGNOSIS — D124 Benign neoplasm of descending colon: Secondary | ICD-10-CM | POA: Diagnosis not present

## 2016-09-29 DIAGNOSIS — I10 Essential (primary) hypertension: Secondary | ICD-10-CM | POA: Diagnosis not present

## 2016-09-29 DIAGNOSIS — K635 Polyp of colon: Secondary | ICD-10-CM | POA: Diagnosis not present

## 2016-09-29 DIAGNOSIS — D123 Benign neoplasm of transverse colon: Secondary | ICD-10-CM | POA: Diagnosis not present

## 2016-09-29 DIAGNOSIS — Z79899 Other long term (current) drug therapy: Secondary | ICD-10-CM | POA: Diagnosis not present

## 2016-09-29 DIAGNOSIS — Z881 Allergy status to other antibiotic agents status: Secondary | ICD-10-CM | POA: Diagnosis not present

## 2016-09-29 DIAGNOSIS — E7800 Pure hypercholesterolemia, unspecified: Secondary | ICD-10-CM | POA: Diagnosis not present

## 2016-10-14 DIAGNOSIS — Z299 Encounter for prophylactic measures, unspecified: Secondary | ICD-10-CM | POA: Diagnosis not present

## 2016-10-14 DIAGNOSIS — J449 Chronic obstructive pulmonary disease, unspecified: Secondary | ICD-10-CM | POA: Diagnosis not present

## 2016-10-14 DIAGNOSIS — R109 Unspecified abdominal pain: Secondary | ICD-10-CM | POA: Diagnosis not present

## 2016-10-14 DIAGNOSIS — I10 Essential (primary) hypertension: Secondary | ICD-10-CM | POA: Diagnosis not present

## 2016-10-14 DIAGNOSIS — F411 Generalized anxiety disorder: Secondary | ICD-10-CM | POA: Diagnosis not present

## 2016-10-28 DIAGNOSIS — M5136 Other intervertebral disc degeneration, lumbar region: Secondary | ICD-10-CM | POA: Diagnosis not present

## 2016-10-28 DIAGNOSIS — K449 Diaphragmatic hernia without obstruction or gangrene: Secondary | ICD-10-CM | POA: Diagnosis not present

## 2016-10-28 DIAGNOSIS — I700 Atherosclerosis of aorta: Secondary | ICD-10-CM | POA: Diagnosis not present

## 2016-10-28 DIAGNOSIS — M879 Osteonecrosis, unspecified: Secondary | ICD-10-CM | POA: Diagnosis not present

## 2016-10-28 DIAGNOSIS — K5730 Diverticulosis of large intestine without perforation or abscess without bleeding: Secondary | ICD-10-CM | POA: Diagnosis not present

## 2016-10-28 DIAGNOSIS — N200 Calculus of kidney: Secondary | ICD-10-CM | POA: Diagnosis not present

## 2016-10-28 DIAGNOSIS — R109 Unspecified abdominal pain: Secondary | ICD-10-CM | POA: Diagnosis not present

## 2016-11-25 DIAGNOSIS — Z6827 Body mass index (BMI) 27.0-27.9, adult: Secondary | ICD-10-CM | POA: Diagnosis not present

## 2016-11-25 DIAGNOSIS — M545 Low back pain: Secondary | ICD-10-CM | POA: Diagnosis not present

## 2016-11-25 DIAGNOSIS — F411 Generalized anxiety disorder: Secondary | ICD-10-CM | POA: Diagnosis not present

## 2016-11-25 DIAGNOSIS — J441 Chronic obstructive pulmonary disease with (acute) exacerbation: Secondary | ICD-10-CM | POA: Diagnosis not present

## 2016-11-25 DIAGNOSIS — Z299 Encounter for prophylactic measures, unspecified: Secondary | ICD-10-CM | POA: Diagnosis not present

## 2016-12-01 DIAGNOSIS — J449 Chronic obstructive pulmonary disease, unspecified: Secondary | ICD-10-CM | POA: Diagnosis not present

## 2016-12-01 DIAGNOSIS — I10 Essential (primary) hypertension: Secondary | ICD-10-CM | POA: Diagnosis not present

## 2016-12-01 DIAGNOSIS — E7800 Pure hypercholesterolemia, unspecified: Secondary | ICD-10-CM | POA: Diagnosis not present

## 2016-12-01 DIAGNOSIS — M159 Polyosteoarthritis, unspecified: Secondary | ICD-10-CM | POA: Diagnosis not present

## 2016-12-07 DIAGNOSIS — Z1389 Encounter for screening for other disorder: Secondary | ICD-10-CM | POA: Diagnosis not present

## 2016-12-07 DIAGNOSIS — Z0000 Encounter for general adult medical examination without abnormal findings: Secondary | ICD-10-CM | POA: Diagnosis not present

## 2016-12-07 DIAGNOSIS — F17200 Nicotine dependence, unspecified, uncomplicated: Secondary | ICD-10-CM | POA: Diagnosis not present

## 2016-12-07 DIAGNOSIS — Z299 Encounter for prophylactic measures, unspecified: Secondary | ICD-10-CM | POA: Diagnosis not present

## 2016-12-07 DIAGNOSIS — Z720 Tobacco use: Secondary | ICD-10-CM | POA: Diagnosis not present

## 2016-12-07 DIAGNOSIS — Z7189 Other specified counseling: Secondary | ICD-10-CM | POA: Diagnosis not present

## 2016-12-07 DIAGNOSIS — F17210 Nicotine dependence, cigarettes, uncomplicated: Secondary | ICD-10-CM | POA: Diagnosis not present

## 2016-12-19 DIAGNOSIS — Z79899 Other long term (current) drug therapy: Secondary | ICD-10-CM | POA: Diagnosis not present

## 2016-12-19 DIAGNOSIS — Z125 Encounter for screening for malignant neoplasm of prostate: Secondary | ICD-10-CM | POA: Diagnosis not present

## 2016-12-19 DIAGNOSIS — E7800 Pure hypercholesterolemia, unspecified: Secondary | ICD-10-CM | POA: Diagnosis not present

## 2016-12-19 DIAGNOSIS — R5383 Other fatigue: Secondary | ICD-10-CM | POA: Diagnosis not present

## 2017-01-20 DIAGNOSIS — E7800 Pure hypercholesterolemia, unspecified: Secondary | ICD-10-CM | POA: Diagnosis not present

## 2017-01-20 DIAGNOSIS — I10 Essential (primary) hypertension: Secondary | ICD-10-CM | POA: Diagnosis not present

## 2017-01-20 DIAGNOSIS — M159 Polyosteoarthritis, unspecified: Secondary | ICD-10-CM | POA: Diagnosis not present

## 2017-01-20 DIAGNOSIS — J449 Chronic obstructive pulmonary disease, unspecified: Secondary | ICD-10-CM | POA: Diagnosis not present

## 2017-01-25 DIAGNOSIS — F411 Generalized anxiety disorder: Secondary | ICD-10-CM | POA: Diagnosis not present

## 2017-01-25 DIAGNOSIS — I10 Essential (primary) hypertension: Secondary | ICD-10-CM | POA: Diagnosis not present

## 2017-01-25 DIAGNOSIS — Z299 Encounter for prophylactic measures, unspecified: Secondary | ICD-10-CM | POA: Diagnosis not present

## 2017-01-25 DIAGNOSIS — Z6827 Body mass index (BMI) 27.0-27.9, adult: Secondary | ICD-10-CM | POA: Diagnosis not present

## 2017-01-25 DIAGNOSIS — J449 Chronic obstructive pulmonary disease, unspecified: Secondary | ICD-10-CM | POA: Diagnosis not present

## 2017-01-25 DIAGNOSIS — M545 Low back pain: Secondary | ICD-10-CM | POA: Diagnosis not present

## 2017-02-02 DIAGNOSIS — S42291A Other displaced fracture of upper end of right humerus, initial encounter for closed fracture: Secondary | ICD-10-CM | POA: Diagnosis not present

## 2017-02-02 DIAGNOSIS — F419 Anxiety disorder, unspecified: Secondary | ICD-10-CM | POA: Diagnosis not present

## 2017-02-02 DIAGNOSIS — J449 Chronic obstructive pulmonary disease, unspecified: Secondary | ICD-10-CM | POA: Diagnosis not present

## 2017-02-02 DIAGNOSIS — Z79899 Other long term (current) drug therapy: Secondary | ICD-10-CM | POA: Diagnosis not present

## 2017-02-02 DIAGNOSIS — Z8673 Personal history of transient ischemic attack (TIA), and cerebral infarction without residual deficits: Secondary | ICD-10-CM | POA: Diagnosis not present

## 2017-02-02 DIAGNOSIS — K219 Gastro-esophageal reflux disease without esophagitis: Secondary | ICD-10-CM | POA: Diagnosis not present

## 2017-02-02 DIAGNOSIS — S42211A Unspecified displaced fracture of surgical neck of right humerus, initial encounter for closed fracture: Secondary | ICD-10-CM | POA: Diagnosis not present

## 2017-02-02 DIAGNOSIS — E7800 Pure hypercholesterolemia, unspecified: Secondary | ICD-10-CM | POA: Diagnosis not present

## 2017-02-02 DIAGNOSIS — I10 Essential (primary) hypertension: Secondary | ICD-10-CM | POA: Diagnosis not present

## 2017-02-02 DIAGNOSIS — X58XXXA Exposure to other specified factors, initial encounter: Secondary | ICD-10-CM | POA: Diagnosis not present

## 2017-02-07 DIAGNOSIS — S42294D Other nondisplaced fracture of upper end of right humerus, subsequent encounter for fracture with routine healing: Secondary | ICD-10-CM | POA: Diagnosis not present

## 2017-02-14 DIAGNOSIS — S42294D Other nondisplaced fracture of upper end of right humerus, subsequent encounter for fracture with routine healing: Secondary | ICD-10-CM | POA: Diagnosis not present

## 2017-02-28 DIAGNOSIS — S42294D Other nondisplaced fracture of upper end of right humerus, subsequent encounter for fracture with routine healing: Secondary | ICD-10-CM | POA: Diagnosis not present

## 2017-03-21 DIAGNOSIS — S42294D Other nondisplaced fracture of upper end of right humerus, subsequent encounter for fracture with routine healing: Secondary | ICD-10-CM | POA: Diagnosis not present

## 2017-03-24 DIAGNOSIS — K219 Gastro-esophageal reflux disease without esophagitis: Secondary | ICD-10-CM | POA: Diagnosis not present

## 2017-03-24 DIAGNOSIS — E7800 Pure hypercholesterolemia, unspecified: Secondary | ICD-10-CM | POA: Diagnosis not present

## 2017-03-24 DIAGNOSIS — Z6826 Body mass index (BMI) 26.0-26.9, adult: Secondary | ICD-10-CM | POA: Diagnosis not present

## 2017-03-24 DIAGNOSIS — J449 Chronic obstructive pulmonary disease, unspecified: Secondary | ICD-10-CM | POA: Diagnosis not present

## 2017-03-24 DIAGNOSIS — F411 Generalized anxiety disorder: Secondary | ICD-10-CM | POA: Diagnosis not present

## 2017-03-24 DIAGNOSIS — Z299 Encounter for prophylactic measures, unspecified: Secondary | ICD-10-CM | POA: Diagnosis not present

## 2017-03-24 DIAGNOSIS — I10 Essential (primary) hypertension: Secondary | ICD-10-CM | POA: Diagnosis not present

## 2017-03-24 DIAGNOSIS — N400 Enlarged prostate without lower urinary tract symptoms: Secondary | ICD-10-CM | POA: Diagnosis not present

## 2017-03-24 DIAGNOSIS — M545 Low back pain: Secondary | ICD-10-CM | POA: Diagnosis not present

## 2017-05-26 DIAGNOSIS — Z299 Encounter for prophylactic measures, unspecified: Secondary | ICD-10-CM | POA: Diagnosis not present

## 2017-05-26 DIAGNOSIS — E039 Hypothyroidism, unspecified: Secondary | ICD-10-CM | POA: Diagnosis not present

## 2017-05-26 DIAGNOSIS — I10 Essential (primary) hypertension: Secondary | ICD-10-CM | POA: Diagnosis not present

## 2017-05-26 DIAGNOSIS — J449 Chronic obstructive pulmonary disease, unspecified: Secondary | ICD-10-CM | POA: Diagnosis not present

## 2017-05-26 DIAGNOSIS — N183 Chronic kidney disease, stage 3 (moderate): Secondary | ICD-10-CM | POA: Diagnosis not present

## 2017-05-26 DIAGNOSIS — F17210 Nicotine dependence, cigarettes, uncomplicated: Secondary | ICD-10-CM | POA: Diagnosis not present

## 2017-05-26 DIAGNOSIS — N400 Enlarged prostate without lower urinary tract symptoms: Secondary | ICD-10-CM | POA: Diagnosis not present

## 2017-05-26 DIAGNOSIS — F411 Generalized anxiety disorder: Secondary | ICD-10-CM | POA: Diagnosis not present

## 2017-05-26 DIAGNOSIS — Z6825 Body mass index (BMI) 25.0-25.9, adult: Secondary | ICD-10-CM | POA: Diagnosis not present

## 2017-05-26 DIAGNOSIS — M545 Low back pain: Secondary | ICD-10-CM | POA: Diagnosis not present

## 2017-05-26 DIAGNOSIS — E7800 Pure hypercholesterolemia, unspecified: Secondary | ICD-10-CM | POA: Diagnosis not present

## 2017-07-26 DIAGNOSIS — Z299 Encounter for prophylactic measures, unspecified: Secondary | ICD-10-CM | POA: Diagnosis not present

## 2017-07-26 DIAGNOSIS — Z6826 Body mass index (BMI) 26.0-26.9, adult: Secondary | ICD-10-CM | POA: Diagnosis not present

## 2017-07-26 DIAGNOSIS — J449 Chronic obstructive pulmonary disease, unspecified: Secondary | ICD-10-CM | POA: Diagnosis not present

## 2017-07-26 DIAGNOSIS — M545 Low back pain: Secondary | ICD-10-CM | POA: Diagnosis not present

## 2017-07-26 DIAGNOSIS — K219 Gastro-esophageal reflux disease without esophagitis: Secondary | ICD-10-CM | POA: Diagnosis not present

## 2017-07-26 DIAGNOSIS — F17210 Nicotine dependence, cigarettes, uncomplicated: Secondary | ICD-10-CM | POA: Diagnosis not present

## 2017-07-26 DIAGNOSIS — E039 Hypothyroidism, unspecified: Secondary | ICD-10-CM | POA: Diagnosis not present

## 2017-07-26 DIAGNOSIS — I10 Essential (primary) hypertension: Secondary | ICD-10-CM | POA: Diagnosis not present

## 2017-07-26 DIAGNOSIS — N400 Enlarged prostate without lower urinary tract symptoms: Secondary | ICD-10-CM | POA: Diagnosis not present

## 2017-07-26 DIAGNOSIS — E7800 Pure hypercholesterolemia, unspecified: Secondary | ICD-10-CM | POA: Diagnosis not present

## 2017-09-07 DIAGNOSIS — E7800 Pure hypercholesterolemia, unspecified: Secondary | ICD-10-CM | POA: Diagnosis not present

## 2017-09-07 DIAGNOSIS — I10 Essential (primary) hypertension: Secondary | ICD-10-CM | POA: Diagnosis not present

## 2017-09-07 DIAGNOSIS — M159 Polyosteoarthritis, unspecified: Secondary | ICD-10-CM | POA: Diagnosis not present

## 2017-09-07 DIAGNOSIS — J449 Chronic obstructive pulmonary disease, unspecified: Secondary | ICD-10-CM | POA: Diagnosis not present

## 2017-09-25 DIAGNOSIS — J449 Chronic obstructive pulmonary disease, unspecified: Secondary | ICD-10-CM | POA: Diagnosis not present

## 2017-09-25 DIAGNOSIS — F17210 Nicotine dependence, cigarettes, uncomplicated: Secondary | ICD-10-CM | POA: Diagnosis not present

## 2017-09-25 DIAGNOSIS — Z299 Encounter for prophylactic measures, unspecified: Secondary | ICD-10-CM | POA: Diagnosis not present

## 2017-09-25 DIAGNOSIS — E039 Hypothyroidism, unspecified: Secondary | ICD-10-CM | POA: Diagnosis not present

## 2017-09-25 DIAGNOSIS — I10 Essential (primary) hypertension: Secondary | ICD-10-CM | POA: Diagnosis not present

## 2017-09-25 DIAGNOSIS — Z6826 Body mass index (BMI) 26.0-26.9, adult: Secondary | ICD-10-CM | POA: Diagnosis not present

## 2017-09-25 DIAGNOSIS — F411 Generalized anxiety disorder: Secondary | ICD-10-CM | POA: Diagnosis not present

## 2017-09-25 DIAGNOSIS — M545 Low back pain: Secondary | ICD-10-CM | POA: Diagnosis not present

## 2017-09-25 DIAGNOSIS — E7800 Pure hypercholesterolemia, unspecified: Secondary | ICD-10-CM | POA: Diagnosis not present

## 2017-10-16 DIAGNOSIS — I10 Essential (primary) hypertension: Secondary | ICD-10-CM | POA: Diagnosis not present

## 2017-10-16 DIAGNOSIS — E7800 Pure hypercholesterolemia, unspecified: Secondary | ICD-10-CM | POA: Diagnosis not present

## 2017-10-16 DIAGNOSIS — M159 Polyosteoarthritis, unspecified: Secondary | ICD-10-CM | POA: Diagnosis not present

## 2017-10-16 DIAGNOSIS — J449 Chronic obstructive pulmonary disease, unspecified: Secondary | ICD-10-CM | POA: Diagnosis not present

## 2017-11-08 DIAGNOSIS — E7800 Pure hypercholesterolemia, unspecified: Secondary | ICD-10-CM | POA: Diagnosis not present

## 2017-11-08 DIAGNOSIS — J449 Chronic obstructive pulmonary disease, unspecified: Secondary | ICD-10-CM | POA: Diagnosis not present

## 2017-11-08 DIAGNOSIS — M159 Polyosteoarthritis, unspecified: Secondary | ICD-10-CM | POA: Diagnosis not present

## 2017-11-08 DIAGNOSIS — I10 Essential (primary) hypertension: Secondary | ICD-10-CM | POA: Diagnosis not present

## 2017-11-23 DIAGNOSIS — F411 Generalized anxiety disorder: Secondary | ICD-10-CM | POA: Diagnosis not present

## 2017-11-23 DIAGNOSIS — Z6825 Body mass index (BMI) 25.0-25.9, adult: Secondary | ICD-10-CM | POA: Diagnosis not present

## 2017-11-23 DIAGNOSIS — N183 Chronic kidney disease, stage 3 (moderate): Secondary | ICD-10-CM | POA: Diagnosis not present

## 2017-11-23 DIAGNOSIS — J449 Chronic obstructive pulmonary disease, unspecified: Secondary | ICD-10-CM | POA: Diagnosis not present

## 2017-11-23 DIAGNOSIS — M545 Low back pain: Secondary | ICD-10-CM | POA: Diagnosis not present

## 2017-11-23 DIAGNOSIS — Z299 Encounter for prophylactic measures, unspecified: Secondary | ICD-10-CM | POA: Diagnosis not present

## 2017-11-23 DIAGNOSIS — E039 Hypothyroidism, unspecified: Secondary | ICD-10-CM | POA: Diagnosis not present

## 2017-11-23 DIAGNOSIS — I10 Essential (primary) hypertension: Secondary | ICD-10-CM | POA: Diagnosis not present

## 2017-11-23 DIAGNOSIS — F17210 Nicotine dependence, cigarettes, uncomplicated: Secondary | ICD-10-CM | POA: Diagnosis not present

## 2017-12-04 DIAGNOSIS — M159 Polyosteoarthritis, unspecified: Secondary | ICD-10-CM | POA: Diagnosis not present

## 2017-12-04 DIAGNOSIS — J449 Chronic obstructive pulmonary disease, unspecified: Secondary | ICD-10-CM | POA: Diagnosis not present

## 2017-12-04 DIAGNOSIS — I10 Essential (primary) hypertension: Secondary | ICD-10-CM | POA: Diagnosis not present

## 2017-12-04 DIAGNOSIS — E7800 Pure hypercholesterolemia, unspecified: Secondary | ICD-10-CM | POA: Diagnosis not present

## 2018-01-22 DIAGNOSIS — I10 Essential (primary) hypertension: Secondary | ICD-10-CM | POA: Diagnosis not present

## 2018-01-22 DIAGNOSIS — M159 Polyosteoarthritis, unspecified: Secondary | ICD-10-CM | POA: Diagnosis not present

## 2018-01-22 DIAGNOSIS — J449 Chronic obstructive pulmonary disease, unspecified: Secondary | ICD-10-CM | POA: Diagnosis not present

## 2018-01-22 DIAGNOSIS — E7800 Pure hypercholesterolemia, unspecified: Secondary | ICD-10-CM | POA: Diagnosis not present

## 2018-01-24 DIAGNOSIS — F411 Generalized anxiety disorder: Secondary | ICD-10-CM | POA: Diagnosis not present

## 2018-01-24 DIAGNOSIS — I10 Essential (primary) hypertension: Secondary | ICD-10-CM | POA: Diagnosis not present

## 2018-01-24 DIAGNOSIS — F17210 Nicotine dependence, cigarettes, uncomplicated: Secondary | ICD-10-CM | POA: Diagnosis not present

## 2018-01-24 DIAGNOSIS — E7800 Pure hypercholesterolemia, unspecified: Secondary | ICD-10-CM | POA: Diagnosis not present

## 2018-01-24 DIAGNOSIS — N183 Chronic kidney disease, stage 3 (moderate): Secondary | ICD-10-CM | POA: Diagnosis not present

## 2018-01-24 DIAGNOSIS — J449 Chronic obstructive pulmonary disease, unspecified: Secondary | ICD-10-CM | POA: Diagnosis not present

## 2018-01-24 DIAGNOSIS — Z6835 Body mass index (BMI) 35.0-35.9, adult: Secondary | ICD-10-CM | POA: Diagnosis not present

## 2018-01-24 DIAGNOSIS — Z299 Encounter for prophylactic measures, unspecified: Secondary | ICD-10-CM | POA: Diagnosis not present

## 2018-01-24 DIAGNOSIS — E039 Hypothyroidism, unspecified: Secondary | ICD-10-CM | POA: Diagnosis not present

## 2018-01-24 DIAGNOSIS — M545 Low back pain: Secondary | ICD-10-CM | POA: Diagnosis not present

## 2018-02-13 DIAGNOSIS — M159 Polyosteoarthritis, unspecified: Secondary | ICD-10-CM | POA: Diagnosis not present

## 2018-02-13 DIAGNOSIS — J449 Chronic obstructive pulmonary disease, unspecified: Secondary | ICD-10-CM | POA: Diagnosis not present

## 2018-02-13 DIAGNOSIS — E7800 Pure hypercholesterolemia, unspecified: Secondary | ICD-10-CM | POA: Diagnosis not present

## 2018-02-13 DIAGNOSIS — I10 Essential (primary) hypertension: Secondary | ICD-10-CM | POA: Diagnosis not present

## 2018-03-13 DIAGNOSIS — I10 Essential (primary) hypertension: Secondary | ICD-10-CM | POA: Diagnosis not present

## 2018-03-13 DIAGNOSIS — M159 Polyosteoarthritis, unspecified: Secondary | ICD-10-CM | POA: Diagnosis not present

## 2018-03-13 DIAGNOSIS — E7800 Pure hypercholesterolemia, unspecified: Secondary | ICD-10-CM | POA: Diagnosis not present

## 2018-03-13 DIAGNOSIS — J449 Chronic obstructive pulmonary disease, unspecified: Secondary | ICD-10-CM | POA: Diagnosis not present

## 2018-03-22 DIAGNOSIS — I10 Essential (primary) hypertension: Secondary | ICD-10-CM | POA: Diagnosis not present

## 2018-03-22 DIAGNOSIS — Z299 Encounter for prophylactic measures, unspecified: Secondary | ICD-10-CM | POA: Diagnosis not present

## 2018-03-22 DIAGNOSIS — Z6825 Body mass index (BMI) 25.0-25.9, adult: Secondary | ICD-10-CM | POA: Diagnosis not present

## 2018-03-22 DIAGNOSIS — J449 Chronic obstructive pulmonary disease, unspecified: Secondary | ICD-10-CM | POA: Diagnosis not present

## 2018-03-22 DIAGNOSIS — F411 Generalized anxiety disorder: Secondary | ICD-10-CM | POA: Diagnosis not present

## 2018-03-22 DIAGNOSIS — R7309 Other abnormal glucose: Secondary | ICD-10-CM | POA: Diagnosis not present

## 2018-03-22 DIAGNOSIS — N183 Chronic kidney disease, stage 3 (moderate): Secondary | ICD-10-CM | POA: Diagnosis not present

## 2018-03-22 DIAGNOSIS — M545 Low back pain: Secondary | ICD-10-CM | POA: Diagnosis not present

## 2018-04-03 DIAGNOSIS — R600 Localized edema: Secondary | ICD-10-CM | POA: Diagnosis not present

## 2018-04-03 DIAGNOSIS — M5127 Other intervertebral disc displacement, lumbosacral region: Secondary | ICD-10-CM | POA: Diagnosis not present

## 2018-04-03 DIAGNOSIS — M4854XD Collapsed vertebra, not elsewhere classified, thoracic region, subsequent encounter for fracture with routine healing: Secondary | ICD-10-CM | POA: Diagnosis not present

## 2018-04-03 DIAGNOSIS — M545 Low back pain: Secondary | ICD-10-CM | POA: Diagnosis not present

## 2018-04-03 DIAGNOSIS — S32048A Other fracture of fourth lumbar vertebra, initial encounter for closed fracture: Secondary | ICD-10-CM | POA: Diagnosis not present

## 2018-04-03 DIAGNOSIS — Z9889 Other specified postprocedural states: Secondary | ICD-10-CM | POA: Diagnosis not present

## 2018-04-12 DIAGNOSIS — J449 Chronic obstructive pulmonary disease, unspecified: Secondary | ICD-10-CM | POA: Diagnosis not present

## 2018-04-12 DIAGNOSIS — S32040A Wedge compression fracture of fourth lumbar vertebra, initial encounter for closed fracture: Secondary | ICD-10-CM | POA: Diagnosis not present

## 2018-04-12 DIAGNOSIS — Z6825 Body mass index (BMI) 25.0-25.9, adult: Secondary | ICD-10-CM | POA: Diagnosis not present

## 2018-04-12 DIAGNOSIS — Z299 Encounter for prophylactic measures, unspecified: Secondary | ICD-10-CM | POA: Diagnosis not present

## 2018-04-12 DIAGNOSIS — I10 Essential (primary) hypertension: Secondary | ICD-10-CM | POA: Diagnosis not present

## 2018-04-13 ENCOUNTER — Other Ambulatory Visit (HOSPITAL_COMMUNITY): Payer: Self-pay | Admitting: Interventional Radiology

## 2018-04-13 DIAGNOSIS — M4850XA Collapsed vertebra, not elsewhere classified, site unspecified, initial encounter for fracture: Secondary | ICD-10-CM

## 2018-04-20 ENCOUNTER — Ambulatory Visit (HOSPITAL_COMMUNITY): Payer: Medicare Other

## 2018-05-02 ENCOUNTER — Encounter (HOSPITAL_COMMUNITY): Payer: Self-pay

## 2018-05-02 ENCOUNTER — Ambulatory Visit (HOSPITAL_COMMUNITY)
Admission: RE | Admit: 2018-05-02 | Discharge: 2018-05-02 | Disposition: A | Discharge status: Home / Self Care | Payer: Medicare Other | Source: Ambulatory Visit | Attending: Interventional Radiology | Admitting: Interventional Radiology

## 2018-05-11 ENCOUNTER — Ambulatory Visit (HOSPITAL_COMMUNITY)
Admission: RE | Admit: 2018-05-11 | Discharge: 2018-05-11 | Disposition: A | Discharge status: Home / Self Care | Payer: Medicare Other | Source: Ambulatory Visit | Attending: Interventional Radiology | Admitting: Interventional Radiology

## 2018-05-11 DIAGNOSIS — S32040A Wedge compression fracture of fourth lumbar vertebra, initial encounter for closed fracture: Secondary | ICD-10-CM | POA: Diagnosis not present

## 2018-05-11 DIAGNOSIS — M4850XA Collapsed vertebra, not elsewhere classified, site unspecified, initial encounter for fracture: Secondary | ICD-10-CM

## 2018-05-11 NOTE — Consult Note (Signed)
Chief Complaint: Patient was seen in consultation today for lumbar 4 compression fracture.  Referring Physician(s): Jerene Bears B  Supervising Physician: Luanne Bras  Patient Status: [facility] - Out-pt  History of Present Illness: Todd Hodge is a 67 y.o. male with a past medical history of hypertension, mitral regurgitation, mixed hyperlipidemia, GERD, avascular necrosis of left femoral head, L5-S1 disc herniation with S1 nerve compression, anxiety, and tobacco abuse. Patient has chronic low back pain associated with L5-S1 disc herniation. A few months ago, he was walking outside, tripped over his dogs, and fell landing on his back on the driveway. Patient states at this time his back pain got significantly worse. He went to see Dr. Woody Seller who sent him to get an MRI which revealed an acute or subacute lumbar 4 compression fracture.  IR requested by Dr. Woody Seller for management of lumbar 4 compression fracture. Patient awake and alert sitting in chair. Accompanied by wife. Complains of lower back pain rated 10+/10 at all times. Denies fever, numbness/tingling down legs, or bladder/bowel incontinence.  Past Medical History:  Diagnosis Date  . Anxiety disorder   . Avascular necrosis of femur head, left   . Essential hypertension, benign   . GERD (gastroesophageal reflux disease)   . History of cardiac catheterization    [place] 2000 - no reported significant CAD  . Mitral regurgitation    Mild, echo, 02/2012  . Mixed hyperlipidemia   . Palpitations   . Seasonal allergies   . Tobacco abuse     Past Surgical History:  Procedure Laterality Date  . BACK SURGERY     May 2012  . NOSE SURGERY     1988    Allergies: Penicillins and Doxycycline  Medications: Prior to Admission medications   Medication Sig Start Date End Date Taking? Authorizing Provider  albuterol (PROAIR HFA) 108 (90 BASE) MCG/ACT inhaler Inhale 2 puffs into the lungs every 6 (six) hours as needed. For  COPD symptoms    [provider]  aspirin EC 81 MG tablet Take 1 tablet (81 mg total) by mouth daily. 06/28/13   Serpe, Burna Forts, PA-C  atenolol (TENORMIN) 50 MG tablet Take 1 tablet by mouth Daily. 02/28/12   [provider]  cetirizine (ZYRTEC) 10 MG tablet Take 10 mg by mouth at bedtime.     [provider]  diazepam (VALIUM) 10 MG tablet Take 10 mg by mouth 4 (four) times daily. For anxiety    [provider]  esomeprazole (NEXIUM) 40 MG capsule Take 40 mg by mouth 2 (two) times daily.     [provider]  lovastatin (MEVACOR) 20 MG tablet Take 20 mg by mouth at bedtime.    [provider]  oxyCODONE-acetaminophen (PERCOCET) 10-325 MG per tablet Take 1 tablet by mouth 4 times daily as needed. For pain 02/15/12   [provider]  vitamin B-12 (CYANOCOBALAMIN) 1000 MCG tablet Take 1,000 mcg by mouth daily.    [provider]     Family History  Problem Relation Age of Onset  . Heart failure Mother     Social History   Socioeconomic History  . Marital status: Married    Spouse name: Not on file  . Number of children: Not on file  . Years of education: Not on file  . Highest education level: Not on file  Occupational History  . Occupation: Disabled    Comment: Civil engineer, contracting Needs  . Financial resource strain: Not on file  .  Food insecurity:    Worry: Not on file    Inability: Not on file  . Transportation needs:    Medical: Not on file    Non-medical: Not on file  Tobacco Use  . Smoking status: Current Every Day Smoker    Packs/day: 1.00    Years: 25.00    Pack years: 25.00    Types: Cigarettes  . Smokeless tobacco: Never Used  Substance and Sexual Activity  . Alcohol use: No  . Drug use: No  . Sexual activity: Not on file  Lifestyle  . Physical activity:    Days per week: Not on file    Minutes per session: Not on file  . Stress: Not on file  Relationships  . Social connections:    Talks on phone:  Not on file    Gets together: Not on file    Attends religious service: Not on file    Active member of club or organization: Not on file    Attends meetings of clubs or organizations: Not on file    Relationship status: Not on file  Other Topics Concern  . Not on file  Social History Narrative  . Not on file     Review of Systems: A 12 point ROS discussed and pertinent positives are indicated in the HPI above.  All other systems are negative.  Review of Systems  Constitutional: Negative for chills and fever.  Respiratory: Negative for shortness of breath and wheezing.   Cardiovascular: Negative for chest pain and palpitations.  Gastrointestinal:       Negative for bowel incontinence.  Genitourinary:       Negative for bladder incontinence.  Musculoskeletal: Positive for back pain.  Neurological: Negative for numbness.  Psychiatric/Behavioral: Negative for behavioral problems and confusion.    Vital Signs: There were no vitals taken for this visit.  Physical Exam  Constitutional: He is oriented to person, place, and time. He appears well-developed and well-nourished. No distress.  Pulmonary/Chest: Effort normal. No respiratory distress.  Musculoskeletal:  Moderate tenderness of midline lower back at approximate level of lumbar 4.  Neurological: He is alert and oriented to person, place, and time.  Skin: Skin is warm and dry.  Psychiatric: He has a normal mood and affect. His behavior is normal. Judgment and thought content normal.  Nursing note and vitals reviewed.    Imaging: No results found.  Labs:  CBC: No results for input(s): WBC, HGB, HCT, PLT in the last 8760 hours.  COAGS: No results for input(s): INR, APTT in the last 8760 hours.  BMP: No results for input(s): NA, K, CL, CO2, GLUCOSE, BUN, CALCIUM, CREATININE, GFRNONAA, GFRAA in the last 8760 hours.  Invalid input(s): CMP  LIVER FUNCTION TESTS: No results for input(s): BILITOT, AST, ALT, ALKPHOS,  PROT, ALBUMIN in the last 8760 hours.  TUMOR MARKERS: No results for input(s): AFPTM, CEA, CA199, CHROMGRNA in the last 8760 hours.  Assessment and Plan:  Lumbar 4 compression fracture. Reviewed imaging with patient and wife. Explained that the best course of management for his lumbar 4 compression fracture to reduce his pain is with a procedure called a kyphoplasty/vertebroplasty. Explained procedure, including risks and benefits.  Plan for follow-up with image-guided lumbar 4 kyphoplasty/vertebroplasty with bone biopsy. Spoke with Dr. Woody Seller who states he would like to request a bone biopsy during procedure. Will fax request form to him. Informed patient that our schedulers will call him to set up this procedure.  All questions answered and concerns  addressed. Patient and wife convey understanding and agree with plan.  Thank you for this interesting consult.  I greatly enjoyed meeting Todd Hodge and look forward to participating in their care.  A copy of this report was sent to the requesting provider on this date.  Electronically Signed: Earley Abide, PA-C 05/11/2018, 2:40 PM   I spent a total of 30 Minutes in face to face in clinical consultation, greater than 50% of which was counseling/coordinating care for lumbar 4 compression fracture.

## 2018-05-16 ENCOUNTER — Telehealth (HOSPITAL_COMMUNITY): Payer: Self-pay

## 2018-05-16 ENCOUNTER — Other Ambulatory Visit (HOSPITAL_COMMUNITY): Payer: Self-pay | Admitting: Interventional Radiology

## 2018-05-16 DIAGNOSIS — S32040A Wedge compression fracture of fourth lumbar vertebra, initial encounter for closed fracture: Secondary | ICD-10-CM

## 2018-05-16 NOTE — Telephone Encounter (Signed)
Called to schedule kyphoplasty, no answer, left vm. AW  

## 2018-05-21 DIAGNOSIS — Z299 Encounter for prophylactic measures, unspecified: Secondary | ICD-10-CM | POA: Diagnosis not present

## 2018-05-21 DIAGNOSIS — Z0000 Encounter for general adult medical examination without abnormal findings: Secondary | ICD-10-CM | POA: Diagnosis not present

## 2018-05-21 DIAGNOSIS — Z6825 Body mass index (BMI) 25.0-25.9, adult: Secondary | ICD-10-CM | POA: Diagnosis not present

## 2018-05-21 DIAGNOSIS — Z1339 Encounter for screening examination for other mental health and behavioral disorders: Secondary | ICD-10-CM | POA: Diagnosis not present

## 2018-05-21 DIAGNOSIS — E039 Hypothyroidism, unspecified: Secondary | ICD-10-CM | POA: Diagnosis not present

## 2018-05-21 DIAGNOSIS — I10 Essential (primary) hypertension: Secondary | ICD-10-CM | POA: Diagnosis not present

## 2018-05-21 DIAGNOSIS — R5383 Other fatigue: Secondary | ICD-10-CM | POA: Diagnosis not present

## 2018-05-21 DIAGNOSIS — E7800 Pure hypercholesterolemia, unspecified: Secondary | ICD-10-CM | POA: Diagnosis not present

## 2018-05-21 DIAGNOSIS — Z7189 Other specified counseling: Secondary | ICD-10-CM | POA: Diagnosis not present

## 2018-05-21 DIAGNOSIS — Z1331 Encounter for screening for depression: Secondary | ICD-10-CM | POA: Diagnosis not present

## 2018-05-21 DIAGNOSIS — M545 Low back pain: Secondary | ICD-10-CM | POA: Diagnosis not present

## 2018-05-21 DIAGNOSIS — Z1211 Encounter for screening for malignant neoplasm of colon: Secondary | ICD-10-CM | POA: Diagnosis not present

## 2018-05-22 ENCOUNTER — Other Ambulatory Visit: Payer: Self-pay | Admitting: Radiology

## 2018-05-23 ENCOUNTER — Other Ambulatory Visit: Payer: Self-pay | Admitting: Student

## 2018-05-24 ENCOUNTER — Other Ambulatory Visit (HOSPITAL_COMMUNITY): Payer: Self-pay | Admitting: Interventional Radiology

## 2018-05-24 ENCOUNTER — Encounter (HOSPITAL_COMMUNITY): Payer: Self-pay

## 2018-05-24 ENCOUNTER — Ambulatory Visit (HOSPITAL_COMMUNITY)
Admission: RE | Admit: 2018-05-24 | Discharge: 2018-05-24 | Disposition: A | Discharge status: Home / Self Care | Payer: Medicare Other | Source: Ambulatory Visit | Attending: Interventional Radiology | Admitting: Interventional Radiology

## 2018-05-24 DIAGNOSIS — Y92009 Unspecified place in unspecified non-institutional (private) residence as the place of occurrence of the external cause: Secondary | ICD-10-CM | POA: Diagnosis not present

## 2018-05-24 DIAGNOSIS — F419 Anxiety disorder, unspecified: Secondary | ICD-10-CM | POA: Insufficient documentation

## 2018-05-24 DIAGNOSIS — I340 Nonrheumatic mitral (valve) insufficiency: Secondary | ICD-10-CM | POA: Diagnosis not present

## 2018-05-24 DIAGNOSIS — Z880 Allergy status to penicillin: Secondary | ICD-10-CM | POA: Insufficient documentation

## 2018-05-24 DIAGNOSIS — M5127 Other intervertebral disc displacement, lumbosacral region: Secondary | ICD-10-CM | POA: Insufficient documentation

## 2018-05-24 DIAGNOSIS — E782 Mixed hyperlipidemia: Secondary | ICD-10-CM | POA: Diagnosis not present

## 2018-05-24 DIAGNOSIS — F17210 Nicotine dependence, cigarettes, uncomplicated: Secondary | ICD-10-CM | POA: Diagnosis not present

## 2018-05-24 DIAGNOSIS — I10 Essential (primary) hypertension: Secondary | ICD-10-CM | POA: Diagnosis not present

## 2018-05-24 DIAGNOSIS — W19XXXA Unspecified fall, initial encounter: Secondary | ICD-10-CM | POA: Diagnosis not present

## 2018-05-24 DIAGNOSIS — S32040A Wedge compression fracture of fourth lumbar vertebra, initial encounter for closed fracture: Secondary | ICD-10-CM

## 2018-05-24 DIAGNOSIS — M4856XA Collapsed vertebra, not elsewhere classified, lumbar region, initial encounter for fracture: Secondary | ICD-10-CM | POA: Diagnosis not present

## 2018-05-24 DIAGNOSIS — S32049A Unspecified fracture of fourth lumbar vertebra, initial encounter for closed fracture: Secondary | ICD-10-CM | POA: Insufficient documentation

## 2018-05-24 DIAGNOSIS — Z7982 Long term (current) use of aspirin: Secondary | ICD-10-CM | POA: Insufficient documentation

## 2018-05-24 DIAGNOSIS — K219 Gastro-esophageal reflux disease without esophagitis: Secondary | ICD-10-CM | POA: Insufficient documentation

## 2018-05-24 HISTORY — PX: IR KYPHO LUMBAR INC FX REDUCE BONE BX UNI/BIL CANNULATION INC/IMAGING: IMG5519

## 2018-05-24 LAB — CBC
HCT: 44.6 % (ref 39.0–52.0)
Hemoglobin: 15.6 g/dL (ref 13.0–17.0)
MCH: 32.2 pg (ref 26.0–34.0)
MCHC: 35 g/dL (ref 30.0–36.0)
MCV: 92.1 fL (ref 78.0–100.0)
PLATELETS: 237 10*3/uL (ref 150–400)
RBC: 4.84 MIL/uL (ref 4.22–5.81)
RDW: 12.3 % (ref 11.5–15.5)
WBC: 7.3 10*3/uL (ref 4.0–10.5)

## 2018-05-24 LAB — PROTIME-INR
INR: 0.95
PROTHROMBIN TIME: 12.6 s (ref 11.4–15.2)

## 2018-05-24 LAB — BASIC METABOLIC PANEL
Anion gap: 12 (ref 5–15)
BUN: <5 mg/dL — ABNORMAL LOW (ref 8–23)
CALCIUM: 9.4 mg/dL (ref 8.9–10.3)
CO2: 24 mmol/L (ref 22–32)
Chloride: 98 mmol/L (ref 98–111)
Creatinine, Ser: 0.89 mg/dL (ref 0.61–1.24)
GFR calc non Af Amer: >60 mL/min (ref >60)
Glucose, Bld: 90 mg/dL (ref 70–99)
Potassium: 3.8 mmol/L (ref 3.5–5.1)
SODIUM: 134 mmol/L — AB (ref 135–145)

## 2018-05-24 MED ORDER — IOPAMIDOL (ISOVUE-300) INJECTION 61%
INTRAVENOUS | Status: AC
  Administered 2018-05-24: 10 mL
  Filled 2018-05-24: qty 50

## 2018-05-24 MED ORDER — FENTANYL CITRATE (PF) 100 MCG/2ML IJ SOLN
INTRAMUSCULAR | Status: AC
  Filled 2018-05-24: qty 2

## 2018-05-24 MED ORDER — MIDAZOLAM HCL 2 MG/2ML IJ SOLN
INTRAMUSCULAR | Status: AC
  Filled 2018-05-24: qty 2

## 2018-05-24 MED ORDER — HYDROMORPHONE HCL 1 MG/ML IJ SOLN
INTRAMUSCULAR | Status: AC | PRN
  Administered 2018-05-24: 1 mg via INTRAVENOUS

## 2018-05-24 MED ORDER — HYDROMORPHONE HCL 1 MG/ML IJ SOLN
INTRAMUSCULAR | Status: AC
  Filled 2018-05-24: qty 1

## 2018-05-24 MED ORDER — VANCOMYCIN HCL IN DEXTROSE 1-5 GM/200ML-% IV SOLN
INTRAVENOUS | Status: AC
  Filled 2018-05-24: qty 200

## 2018-05-24 MED ORDER — VANCOMYCIN HCL IN DEXTROSE 1-5 GM/200ML-% IV SOLN
1000.0000 mg | INTRAVENOUS | Status: AC
  Administered 2018-05-24: 1000 mg via INTRAVENOUS

## 2018-05-24 MED ORDER — GELATIN ABSORBABLE 12-7 MM EX MISC
CUTANEOUS | Status: AC
  Filled 2018-05-24: qty 1

## 2018-05-24 MED ORDER — TOBRAMYCIN SULFATE 1.2 G IJ SOLR
INTRAMUSCULAR | Status: AC
  Administered 2018-05-24: 10:00:00
  Filled 2018-05-24: qty 1.2

## 2018-05-24 MED ORDER — FENTANYL CITRATE (PF) 100 MCG/2ML IJ SOLN
INTRAMUSCULAR | Status: AC | PRN
  Administered 2018-05-24 (×3): 25 ug via INTRAVENOUS

## 2018-05-24 MED ORDER — MIDAZOLAM HCL 2 MG/2ML IJ SOLN
INTRAMUSCULAR | Status: AC | PRN
Start: 2018-05-24 — End: 2018-05-24
  Administered 2018-05-24 (×3): 1 mg via INTRAVENOUS

## 2018-05-24 MED ORDER — BUPIVACAINE HCL (PF) 0.5 % IJ SOLN
INTRAMUSCULAR | Status: AC
  Administered 2018-05-24: 20 mL
  Filled 2018-05-24: qty 30

## 2018-05-24 MED ORDER — SODIUM CHLORIDE 0.9 % IV SOLN
INTRAVENOUS | Status: DC
  Administered 2018-05-24: 07:00:00 via INTRAVENOUS

## 2018-05-24 MED ORDER — SODIUM CHLORIDE 0.9 % IV SOLN: INTRAVENOUS | Status: AC

## 2018-05-24 NOTE — Discharge Instructions (Addendum)
1. No stooping ,bending or lifting more than 10 lbs for 2 weeks. 2.Use walker to amnbulate for 2 eeks. 3.NO driving for 2 weeks. 4.RTC PRN 2 weeks  KYPHOPLASTY/VERTEBROPLASTY DISCHARGE INSTRUCTIONS  Medications: (check all that apply)     Resume all home medications as before procedure.                        Continue your pain medications as prescribed as needed.  Over the next 3-5 days, decrease your pain medication as tolerated.  Over the counter medications (i.e. Tylenol, ibuprofen, and aleve) may be substituted once severe/moderate pain symptoms have subsided.   Wound Care: - Bandages may be removed the day following your procedure.  You may get your incision wet once bandages are removed.  Bandaids may be used to cover the incisions until scab formation.  Topical ointments are optional.  - If you develop a fever greater than 101 degrees, have increased skin redness at the incision sites or pus-like oozing from incisions occurring within 1 week of the procedure, contact radiology at [phone] or [phone].  - Ice pack to back for 15-20 minutes 2-3 time per day for first 2-3 days post procedure.  The ice will expedite muscle healing and help with the pain from the incisions.   Activity: - Bedrest today with limited activity for 24 hours post procedure.  - No driving for 48 hours.  - Increase your activity as tolerated after bedrest (with assistance if necessary).  - Refrain from any strenuous activity or heavy lifting (greater than 10 lbs.).   Follow up: - Contact radiology at [phone] or [phone] if any questions/concerns.  - A physician assistant from radiology will contact you in approximately 1 week.  - If a biopsy was performed at the time of your procedure, your referring physician should receive the results in usually 2-3 days.

## 2018-05-24 NOTE — Procedures (Signed)
S/P L4 balloon KP 

## 2018-05-24 NOTE — H&P (Signed)
Chief Complaint: Patient was seen in consultation today for Lumbar 4 kyphoplasty at the request of Dr Francesco Runner   Supervising Physician: Luanne Bras  Patient Status: [facility] - Out-pt  History of Present Illness: Todd Hodge is a 67 y.o. male   Chronic low back pian for ys Fell at home few weeks ago and back pain worsened significantly  04/03/18: MRI:  IMPRESSION: Acute or subacute inferior endplate fracture at L4 with loss of height of 30%. Diffuse marrow edema. Most likely, this is a benign fracture, but an underlying lesion could be hidden within the extensive edema. Arguing against the presence of an underlying lesion, there are no other marrow space lesions. There is an old healed minor fracture at T11. Left posterolateral disc herniation at L5-S1 with small fragments adjacent to the left S1 nerve which could cause left S1 nerve compression. There has been a previous left hemilaminectomy at this level.  Dr Woody Seller requesting bone bx and treatment of painful L4 fracture Consulted with Dr Estanislado Pandy 05/11/2018   Past Medical History:  Diagnosis Date  . Anxiety disorder   . Avascular necrosis of femur head, left ([place])   . Essential hypertension, benign   . GERD (gastroesophageal reflux disease)   . History of cardiac catheterization    [place] 2000 - no reported significant CAD  . Mitral regurgitation    Mild, echo, 02/2012  . Mixed hyperlipidemia   . Palpitations   . Seasonal allergies   . Tobacco abuse     Past Surgical History:  Procedure Laterality Date  . BACK SURGERY     May 2012  . NOSE SURGERY     1988    Allergies: Penicillins and Doxycycline  Medications: Prior to Admission medications   Medication Sig Start Date End Date Taking? Authorizing Provider  aspirin EC 81 MG tablet Take 1 tablet (81 mg total) by mouth daily. 06/28/13  Yes Serpe, Burna Forts, PA-C  atenolol (TENORMIN) 50 MG tablet Take 50 mg by mouth daily.  02/28/12  Yes [provider]  cetirizine (ZYRTEC) 10 MG tablet Take 10 mg by mouth at bedtime.    Yes [provider]  diazepam (VALIUM) 5 MG tablet Take 5 mg by mouth 3 (three) times daily as needed for anxiety.    Yes [provider]  esomeprazole (NEXIUM) 40 MG capsule Take 40 mg by mouth 2 (two) times daily.    Yes [provider]  lovastatin (MEVACOR) 20 MG tablet Take 20 mg by mouth at bedtime.   Yes [provider]  oxyCODONE-acetaminophen (PERCOCET) 10-325 MG per tablet Take 1 tablet by mouth 4 times daily as needed for pain.  02/15/12  Yes [provider]  tamsulosin (FLOMAX) 0.4 MG CAPS capsule Take 0.4 mg by mouth daily.   Yes [provider]  albuterol (PROAIR HFA) 108 (90 BASE) MCG/ACT inhaler Inhale 2 puffs into the lungs every 6 (six) hours as needed for wheezing or shortness of breath.     [provider]     Family History  Problem Relation Age of Onset  . Heart failure Mother     Social History   Socioeconomic History  . Marital status: Married    Spouse name: Not on file  . Number of children: Not on file  . Years of education: Not on file  . Highest education level: Not on file  Occupational History  . Occupation: Disabled    Comment: Civil engineer, contracting Needs  .  Financial resource strain: Not on file  . Food insecurity:    Worry: Not on file    Inability: Not on file  . Transportation needs:    Medical: Not on file    Non-medical: Not on file  Tobacco Use  . Smoking status: Current Every Day Smoker    Packs/day: 1.00    Years: 25.00    Pack years: 25.00    Types: Cigarettes  . Smokeless tobacco: Never Used  Substance and Sexual Activity  . Alcohol use: No  . Drug use: No  . Sexual activity: Not on file  Lifestyle  . Physical activity:    Days per week: Not on file    Minutes per session: Not on file  . Stress: Not on file  Relationships  . Social connections:    Talks on phone: Not on file    Gets  together: Not on file    Attends religious service: Not on file    Active member of club or organization: Not on file    Attends meetings of clubs or organizations: Not on file    Relationship status: Not on file  Other Topics Concern  . Not on file  Social History Narrative  . Not on file     Review of Systems: A 12 point ROS discussed and pertinent positives are indicated in the HPI above.  All other systems are negative.  Review of Systems  Constitutional: Negative for activity change, fatigue and fever.  Respiratory: Negative for cough and shortness of breath.   Cardiovascular: Negative for chest pain.  Gastrointestinal: Negative for abdominal pain.  Musculoskeletal: Positive for back pain and gait problem.  Neurological: Negative for weakness.  Psychiatric/Behavioral: Negative for behavioral problems and confusion.    Vital Signs: BP (!) 167/80   Pulse 61   Temp 98.8 F (37.1 C) (Oral)   Resp 16   Ht 5\' 9"  (1.753 m)   Wt 150 lb (68 kg)   SpO2 98%   BMI 22.15 kg/m   Physical Exam  Constitutional: He is oriented to person, place, and time.  Cardiovascular: Normal rate, regular rhythm and normal heart sounds.  Pulmonary/Chest: Effort normal and breath sounds normal.  Abdominal: Soft. Bowel sounds are normal.  Musculoskeletal: Normal range of motion.  Low back pain  Neurological: He is alert and oriented to person, place, and time.  Skin: Skin is warm and dry.  Psychiatric: He has a normal mood and affect. His behavior is normal. Judgment and thought content normal.  Nursing note and vitals reviewed.   Imaging: No results found.  Labs:  CBC: Recent Labs    05/24/18 0641  WBC 7.3  HGB 15.6  HCT 44.6  PLT 237    COAGS: Recent Labs    05/24/18 0641  INR 0.95    BMP: Recent Labs    05/24/18 0641  NA 134*  K 3.8  CL 98  CO2 24  GLUCOSE 90  BUN <5*  CALCIUM 9.4  CREATININE 0.89  GFRNONAA >60  GFRAA >60    LIVER FUNCTION TESTS: No  results for input(s): BILITOT, AST, ALT, ALKPHOS, PROT, ALBUMIN in the last 8760 hours.  TUMOR MARKERS: No results for input(s): AFPTM, CEA, CA199, CHROMGRNA in the last 8760 hours.  Assessment and Plan:  Painful Lumbar 4 fracture Scheduled for bone bx and kyphoplasty Risks and benefits of L4 KP were discussed with the patient including, but not limited to education regarding the natural healing process of compression fractures  without intervention, bleeding, infection, cement migration which may cause spinal cord damage, paralysis, pulmonary embolism or even death.  This interventional procedure involves the use of X-rays and because of the nature of the planned procedure, it is possible that we will have prolonged use of X-ray fluoroscopy.  Potential radiation risks to you include (but are not limited to) the following: - A slightly elevated risk for cancer  several years later in life. This risk is typically less than 0.5% percent. This risk is low in comparison to the normal incidence of human cancer, which is 33% for women and 50% for men according to the [place]. - Radiation induced injury can include skin redness, resembling a rash, tissue breakdown / ulcers and hair loss (which can be temporary or permanent).   The likelihood of either of these occurring depends on the difficulty of the procedure and whether you are sensitive to radiation due to previous procedures, disease, or genetic conditions.   IF your procedure requires a prolonged use of radiation, you will be notified and given written instructions for further action.  It is your responsibility to monitor the irradiated area for the 2 weeks following the procedure and to notify your physician if you are concerned that you have suffered a radiation induced injury.    All of the patient's questions were answered, patient is agreeable to proceed.  Consent signed and in chart.  Thank you for this interesting  consult.  I greatly enjoyed meeting DORION PETILLO and look forward to participating in their care.  A copy of this report was sent to the requesting provider on this date.  Electronically Signed: Lavonia Drafts, PA-C 05/24/2018, 7:39 AM   I spent a total of  30 Minutes   in face to face in clinical consultation, greater than 50% of which was counseling/coordinating care for Lumbar 4 KP

## 2018-05-25 ENCOUNTER — Encounter (HOSPITAL_COMMUNITY): Payer: Self-pay | Admitting: Interventional Radiology

## 2018-07-04 DIAGNOSIS — M159 Polyosteoarthritis, unspecified: Secondary | ICD-10-CM | POA: Diagnosis not present

## 2018-07-04 DIAGNOSIS — I10 Essential (primary) hypertension: Secondary | ICD-10-CM | POA: Diagnosis not present

## 2018-07-04 DIAGNOSIS — E7800 Pure hypercholesterolemia, unspecified: Secondary | ICD-10-CM | POA: Diagnosis not present

## 2018-07-04 DIAGNOSIS — J449 Chronic obstructive pulmonary disease, unspecified: Secondary | ICD-10-CM | POA: Diagnosis not present

## 2018-07-11 DIAGNOSIS — E785 Hyperlipidemia, unspecified: Secondary | ICD-10-CM | POA: Diagnosis not present

## 2018-07-11 DIAGNOSIS — K219 Gastro-esophageal reflux disease without esophagitis: Secondary | ICD-10-CM | POA: Diagnosis not present

## 2018-07-11 DIAGNOSIS — W540XXA Bitten by dog, initial encounter: Secondary | ICD-10-CM | POA: Diagnosis not present

## 2018-07-11 DIAGNOSIS — Z880 Allergy status to penicillin: Secondary | ICD-10-CM | POA: Diagnosis not present

## 2018-07-11 DIAGNOSIS — I10 Essential (primary) hypertension: Secondary | ICD-10-CM | POA: Diagnosis not present

## 2018-07-11 DIAGNOSIS — F17210 Nicotine dependence, cigarettes, uncomplicated: Secondary | ICD-10-CM | POA: Diagnosis not present

## 2018-07-11 DIAGNOSIS — Z7902 Long term (current) use of antithrombotics/antiplatelets: Secondary | ICD-10-CM | POA: Diagnosis not present

## 2018-07-11 DIAGNOSIS — Z888 Allergy status to other drugs, medicaments and biological substances status: Secondary | ICD-10-CM | POA: Diagnosis not present

## 2018-07-11 DIAGNOSIS — S91351A Open bite, right foot, initial encounter: Secondary | ICD-10-CM | POA: Diagnosis not present

## 2018-07-11 DIAGNOSIS — F419 Anxiety disorder, unspecified: Secondary | ICD-10-CM | POA: Diagnosis not present

## 2018-07-11 DIAGNOSIS — L03115 Cellulitis of right lower limb: Secondary | ICD-10-CM | POA: Diagnosis not present

## 2018-07-11 DIAGNOSIS — Z79899 Other long term (current) drug therapy: Secondary | ICD-10-CM | POA: Diagnosis not present

## 2018-07-20 DIAGNOSIS — I10 Essential (primary) hypertension: Secondary | ICD-10-CM | POA: Diagnosis not present

## 2018-07-20 DIAGNOSIS — J449 Chronic obstructive pulmonary disease, unspecified: Secondary | ICD-10-CM | POA: Diagnosis not present

## 2018-07-20 DIAGNOSIS — M545 Low back pain: Secondary | ICD-10-CM | POA: Diagnosis not present

## 2018-07-20 DIAGNOSIS — F411 Generalized anxiety disorder: Secondary | ICD-10-CM | POA: Diagnosis not present

## 2018-07-20 DIAGNOSIS — Z299 Encounter for prophylactic measures, unspecified: Secondary | ICD-10-CM | POA: Diagnosis not present

## 2018-07-20 DIAGNOSIS — N183 Chronic kidney disease, stage 3 (moderate): Secondary | ICD-10-CM | POA: Diagnosis not present

## 2018-07-20 DIAGNOSIS — Z6824 Body mass index (BMI) 24.0-24.9, adult: Secondary | ICD-10-CM | POA: Diagnosis not present

## 2018-08-07 DIAGNOSIS — S32040A Wedge compression fracture of fourth lumbar vertebra, initial encounter for closed fracture: Secondary | ICD-10-CM | POA: Diagnosis not present

## 2018-08-07 DIAGNOSIS — M8440XA Pathological fracture, unspecified site, initial encounter for fracture: Secondary | ICD-10-CM | POA: Diagnosis not present

## 2018-08-20 DIAGNOSIS — M159 Polyosteoarthritis, unspecified: Secondary | ICD-10-CM | POA: Diagnosis not present

## 2018-08-20 DIAGNOSIS — I10 Essential (primary) hypertension: Secondary | ICD-10-CM | POA: Diagnosis not present

## 2018-08-20 DIAGNOSIS — E7800 Pure hypercholesterolemia, unspecified: Secondary | ICD-10-CM | POA: Diagnosis not present

## 2018-08-20 DIAGNOSIS — J449 Chronic obstructive pulmonary disease, unspecified: Secondary | ICD-10-CM | POA: Diagnosis not present

## 2018-09-18 DIAGNOSIS — F411 Generalized anxiety disorder: Secondary | ICD-10-CM | POA: Diagnosis not present

## 2018-09-18 DIAGNOSIS — F17210 Nicotine dependence, cigarettes, uncomplicated: Secondary | ICD-10-CM | POA: Diagnosis not present

## 2018-09-18 DIAGNOSIS — I10 Essential (primary) hypertension: Secondary | ICD-10-CM | POA: Diagnosis not present

## 2018-09-18 DIAGNOSIS — Z6824 Body mass index (BMI) 24.0-24.9, adult: Secondary | ICD-10-CM | POA: Diagnosis not present

## 2018-09-18 DIAGNOSIS — Z299 Encounter for prophylactic measures, unspecified: Secondary | ICD-10-CM | POA: Diagnosis not present

## 2018-09-18 DIAGNOSIS — M545 Low back pain: Secondary | ICD-10-CM | POA: Diagnosis not present

## 2018-09-18 DIAGNOSIS — K219 Gastro-esophageal reflux disease without esophagitis: Secondary | ICD-10-CM | POA: Diagnosis not present

## 2018-10-09 DIAGNOSIS — Z299 Encounter for prophylactic measures, unspecified: Secondary | ICD-10-CM | POA: Diagnosis not present

## 2018-10-09 DIAGNOSIS — R197 Diarrhea, unspecified: Secondary | ICD-10-CM | POA: Diagnosis not present

## 2018-10-09 DIAGNOSIS — Z6824 Body mass index (BMI) 24.0-24.9, adult: Secondary | ICD-10-CM | POA: Diagnosis not present

## 2018-10-09 DIAGNOSIS — I10 Essential (primary) hypertension: Secondary | ICD-10-CM | POA: Diagnosis not present

## 2018-10-10 DIAGNOSIS — E7800 Pure hypercholesterolemia, unspecified: Secondary | ICD-10-CM | POA: Diagnosis not present

## 2018-10-10 DIAGNOSIS — Z125 Encounter for screening for malignant neoplasm of prostate: Secondary | ICD-10-CM | POA: Diagnosis not present

## 2018-10-10 DIAGNOSIS — R5383 Other fatigue: Secondary | ICD-10-CM | POA: Diagnosis not present

## 2018-10-10 DIAGNOSIS — R197 Diarrhea, unspecified: Secondary | ICD-10-CM | POA: Diagnosis not present

## 2018-10-10 DIAGNOSIS — E039 Hypothyroidism, unspecified: Secondary | ICD-10-CM | POA: Diagnosis not present

## 2018-10-10 DIAGNOSIS — Z79899 Other long term (current) drug therapy: Secondary | ICD-10-CM | POA: Diagnosis not present

## 2018-11-02 DIAGNOSIS — J449 Chronic obstructive pulmonary disease, unspecified: Secondary | ICD-10-CM | POA: Diagnosis not present

## 2018-11-02 DIAGNOSIS — Z299 Encounter for prophylactic measures, unspecified: Secondary | ICD-10-CM | POA: Diagnosis not present

## 2018-11-02 DIAGNOSIS — J329 Chronic sinusitis, unspecified: Secondary | ICD-10-CM | POA: Diagnosis not present

## 2018-11-02 DIAGNOSIS — Z6825 Body mass index (BMI) 25.0-25.9, adult: Secondary | ICD-10-CM | POA: Diagnosis not present

## 2018-11-02 DIAGNOSIS — I10 Essential (primary) hypertension: Secondary | ICD-10-CM | POA: Diagnosis not present

## 2018-11-15 DIAGNOSIS — E7800 Pure hypercholesterolemia, unspecified: Secondary | ICD-10-CM | POA: Diagnosis not present

## 2018-11-15 DIAGNOSIS — I10 Essential (primary) hypertension: Secondary | ICD-10-CM | POA: Diagnosis not present

## 2018-11-15 DIAGNOSIS — J449 Chronic obstructive pulmonary disease, unspecified: Secondary | ICD-10-CM | POA: Diagnosis not present

## 2018-11-15 DIAGNOSIS — M159 Polyosteoarthritis, unspecified: Secondary | ICD-10-CM | POA: Diagnosis not present

## 2018-11-16 DIAGNOSIS — I10 Essential (primary) hypertension: Secondary | ICD-10-CM | POA: Diagnosis not present

## 2018-11-16 DIAGNOSIS — J449 Chronic obstructive pulmonary disease, unspecified: Secondary | ICD-10-CM | POA: Diagnosis not present

## 2018-11-16 DIAGNOSIS — F411 Generalized anxiety disorder: Secondary | ICD-10-CM | POA: Diagnosis not present

## 2018-11-16 DIAGNOSIS — Z299 Encounter for prophylactic measures, unspecified: Secondary | ICD-10-CM | POA: Diagnosis not present

## 2018-11-16 DIAGNOSIS — M1990 Unspecified osteoarthritis, unspecified site: Secondary | ICD-10-CM | POA: Diagnosis not present

## 2018-11-16 DIAGNOSIS — Z6825 Body mass index (BMI) 25.0-25.9, adult: Secondary | ICD-10-CM | POA: Diagnosis not present

## 2019-01-07 DIAGNOSIS — E7800 Pure hypercholesterolemia, unspecified: Secondary | ICD-10-CM | POA: Diagnosis not present

## 2019-01-07 DIAGNOSIS — M159 Polyosteoarthritis, unspecified: Secondary | ICD-10-CM | POA: Diagnosis not present

## 2019-01-07 DIAGNOSIS — I10 Essential (primary) hypertension: Secondary | ICD-10-CM | POA: Diagnosis not present

## 2019-01-07 DIAGNOSIS — J449 Chronic obstructive pulmonary disease, unspecified: Secondary | ICD-10-CM | POA: Diagnosis not present

## 2019-01-16 DIAGNOSIS — J449 Chronic obstructive pulmonary disease, unspecified: Secondary | ICD-10-CM | POA: Diagnosis not present

## 2019-01-16 DIAGNOSIS — Z6827 Body mass index (BMI) 27.0-27.9, adult: Secondary | ICD-10-CM | POA: Diagnosis not present

## 2019-01-16 DIAGNOSIS — I10 Essential (primary) hypertension: Secondary | ICD-10-CM | POA: Diagnosis not present

## 2019-01-16 DIAGNOSIS — N183 Chronic kidney disease, stage 3 (moderate): Secondary | ICD-10-CM | POA: Diagnosis not present

## 2019-01-16 DIAGNOSIS — Z299 Encounter for prophylactic measures, unspecified: Secondary | ICD-10-CM | POA: Diagnosis not present

## 2019-01-16 DIAGNOSIS — N400 Enlarged prostate without lower urinary tract symptoms: Secondary | ICD-10-CM | POA: Diagnosis not present

## 2019-04-04 DIAGNOSIS — H5213 Myopia, bilateral: Secondary | ICD-10-CM | POA: Diagnosis not present

## 2019-04-04 DIAGNOSIS — H2513 Age-related nuclear cataract, bilateral: Secondary | ICD-10-CM | POA: Diagnosis not present

## 2019-05-13 DIAGNOSIS — F411 Generalized anxiety disorder: Secondary | ICD-10-CM | POA: Diagnosis not present

## 2019-05-13 DIAGNOSIS — Z299 Encounter for prophylactic measures, unspecified: Secondary | ICD-10-CM | POA: Diagnosis not present

## 2019-05-13 DIAGNOSIS — M545 Low back pain: Secondary | ICD-10-CM | POA: Diagnosis not present

## 2019-05-13 DIAGNOSIS — Z6825 Body mass index (BMI) 25.0-25.9, adult: Secondary | ICD-10-CM | POA: Diagnosis not present

## 2019-05-13 DIAGNOSIS — I10 Essential (primary) hypertension: Secondary | ICD-10-CM | POA: Diagnosis not present

## 2019-05-21 DIAGNOSIS — J449 Chronic obstructive pulmonary disease, unspecified: Secondary | ICD-10-CM | POA: Diagnosis not present

## 2019-05-21 DIAGNOSIS — I10 Essential (primary) hypertension: Secondary | ICD-10-CM | POA: Diagnosis not present

## 2019-05-21 DIAGNOSIS — E7800 Pure hypercholesterolemia, unspecified: Secondary | ICD-10-CM | POA: Diagnosis not present

## 2019-05-21 DIAGNOSIS — M159 Polyosteoarthritis, unspecified: Secondary | ICD-10-CM | POA: Diagnosis not present

## 2019-05-28 DIAGNOSIS — Z7189 Other specified counseling: Secondary | ICD-10-CM | POA: Diagnosis not present

## 2019-05-28 DIAGNOSIS — Z6825 Body mass index (BMI) 25.0-25.9, adult: Secondary | ICD-10-CM | POA: Diagnosis not present

## 2019-05-28 DIAGNOSIS — Z1331 Encounter for screening for depression: Secondary | ICD-10-CM | POA: Diagnosis not present

## 2019-05-28 DIAGNOSIS — E039 Hypothyroidism, unspecified: Secondary | ICD-10-CM | POA: Diagnosis not present

## 2019-05-28 DIAGNOSIS — R5383 Other fatigue: Secondary | ICD-10-CM | POA: Diagnosis not present

## 2019-05-28 DIAGNOSIS — I10 Essential (primary) hypertension: Secondary | ICD-10-CM | POA: Diagnosis not present

## 2019-05-28 DIAGNOSIS — Z0000 Encounter for general adult medical examination without abnormal findings: Secondary | ICD-10-CM | POA: Diagnosis not present

## 2019-05-28 DIAGNOSIS — Z299 Encounter for prophylactic measures, unspecified: Secondary | ICD-10-CM | POA: Diagnosis not present

## 2019-05-28 DIAGNOSIS — Z1339 Encounter for screening examination for other mental health and behavioral disorders: Secondary | ICD-10-CM | POA: Diagnosis not present

## 2019-06-11 DIAGNOSIS — E039 Hypothyroidism, unspecified: Secondary | ICD-10-CM | POA: Diagnosis not present

## 2019-06-11 DIAGNOSIS — Z79899 Other long term (current) drug therapy: Secondary | ICD-10-CM | POA: Diagnosis not present

## 2019-06-11 DIAGNOSIS — E7800 Pure hypercholesterolemia, unspecified: Secondary | ICD-10-CM | POA: Diagnosis not present

## 2019-06-11 DIAGNOSIS — R5383 Other fatigue: Secondary | ICD-10-CM | POA: Diagnosis not present

## 2019-06-11 DIAGNOSIS — N400 Enlarged prostate without lower urinary tract symptoms: Secondary | ICD-10-CM | POA: Diagnosis not present

## 2019-06-12 DIAGNOSIS — R748 Abnormal levels of other serum enzymes: Secondary | ICD-10-CM | POA: Diagnosis not present

## 2019-06-12 DIAGNOSIS — S32040A Wedge compression fracture of fourth lumbar vertebra, initial encounter for closed fracture: Secondary | ICD-10-CM | POA: Diagnosis not present

## 2019-06-12 DIAGNOSIS — I10 Essential (primary) hypertension: Secondary | ICD-10-CM | POA: Diagnosis not present

## 2019-06-12 DIAGNOSIS — J449 Chronic obstructive pulmonary disease, unspecified: Secondary | ICD-10-CM | POA: Diagnosis not present

## 2019-06-12 DIAGNOSIS — Z299 Encounter for prophylactic measures, unspecified: Secondary | ICD-10-CM | POA: Diagnosis not present

## 2019-06-12 DIAGNOSIS — Z6825 Body mass index (BMI) 25.0-25.9, adult: Secondary | ICD-10-CM | POA: Diagnosis not present

## 2019-06-20 DIAGNOSIS — I10 Essential (primary) hypertension: Secondary | ICD-10-CM | POA: Diagnosis not present

## 2019-06-20 DIAGNOSIS — M159 Polyosteoarthritis, unspecified: Secondary | ICD-10-CM | POA: Diagnosis not present

## 2019-06-20 DIAGNOSIS — J449 Chronic obstructive pulmonary disease, unspecified: Secondary | ICD-10-CM | POA: Diagnosis not present

## 2019-06-20 DIAGNOSIS — E7800 Pure hypercholesterolemia, unspecified: Secondary | ICD-10-CM | POA: Diagnosis not present

## 2019-07-12 DIAGNOSIS — F411 Generalized anxiety disorder: Secondary | ICD-10-CM | POA: Diagnosis not present

## 2019-07-12 DIAGNOSIS — I10 Essential (primary) hypertension: Secondary | ICD-10-CM | POA: Diagnosis not present

## 2019-07-12 DIAGNOSIS — J449 Chronic obstructive pulmonary disease, unspecified: Secondary | ICD-10-CM | POA: Diagnosis not present

## 2019-07-12 DIAGNOSIS — M545 Low back pain: Secondary | ICD-10-CM | POA: Diagnosis not present

## 2019-07-12 DIAGNOSIS — Z299 Encounter for prophylactic measures, unspecified: Secondary | ICD-10-CM | POA: Diagnosis not present

## 2019-07-22 DIAGNOSIS — J449 Chronic obstructive pulmonary disease, unspecified: Secondary | ICD-10-CM | POA: Diagnosis not present

## 2019-07-22 DIAGNOSIS — I10 Essential (primary) hypertension: Secondary | ICD-10-CM | POA: Diagnosis not present

## 2019-07-22 DIAGNOSIS — M159 Polyosteoarthritis, unspecified: Secondary | ICD-10-CM | POA: Diagnosis not present

## 2019-07-22 DIAGNOSIS — E7800 Pure hypercholesterolemia, unspecified: Secondary | ICD-10-CM | POA: Diagnosis not present

## 2019-08-09 DIAGNOSIS — J449 Chronic obstructive pulmonary disease, unspecified: Secondary | ICD-10-CM | POA: Diagnosis not present

## 2019-08-09 DIAGNOSIS — Z6825 Body mass index (BMI) 25.0-25.9, adult: Secondary | ICD-10-CM | POA: Diagnosis not present

## 2019-08-09 DIAGNOSIS — Z299 Encounter for prophylactic measures, unspecified: Secondary | ICD-10-CM | POA: Diagnosis not present

## 2019-08-09 DIAGNOSIS — M545 Low back pain: Secondary | ICD-10-CM | POA: Diagnosis not present

## 2019-08-09 DIAGNOSIS — N183 Chronic kidney disease, stage 3 (moderate): Secondary | ICD-10-CM | POA: Diagnosis not present

## 2019-08-09 DIAGNOSIS — F411 Generalized anxiety disorder: Secondary | ICD-10-CM | POA: Diagnosis not present

## 2019-09-04 ENCOUNTER — Other Ambulatory Visit: Payer: Self-pay

## 2019-09-04 DIAGNOSIS — Z20828 Contact with and (suspected) exposure to other viral communicable diseases: Secondary | ICD-10-CM | POA: Diagnosis not present

## 2019-09-04 DIAGNOSIS — Z20822 Contact with and (suspected) exposure to covid-19: Secondary | ICD-10-CM

## 2019-09-06 DIAGNOSIS — I10 Essential (primary) hypertension: Secondary | ICD-10-CM | POA: Diagnosis not present

## 2019-09-06 DIAGNOSIS — Z713 Dietary counseling and surveillance: Secondary | ICD-10-CM | POA: Diagnosis not present

## 2019-09-06 DIAGNOSIS — M545 Low back pain: Secondary | ICD-10-CM | POA: Diagnosis not present

## 2019-09-06 DIAGNOSIS — Z299 Encounter for prophylactic measures, unspecified: Secondary | ICD-10-CM | POA: Diagnosis not present

## 2019-09-06 LAB — NOVEL CORONAVIRUS, NAA: SARS-CoV-2, NAA: DETECTED — AB

## 2019-09-26 DIAGNOSIS — F419 Anxiety disorder, unspecified: Secondary | ICD-10-CM | POA: Diagnosis not present

## 2019-09-26 DIAGNOSIS — R911 Solitary pulmonary nodule: Secondary | ICD-10-CM | POA: Diagnosis not present

## 2019-09-26 DIAGNOSIS — R079 Chest pain, unspecified: Secondary | ICD-10-CM | POA: Diagnosis not present

## 2019-09-26 DIAGNOSIS — E785 Hyperlipidemia, unspecified: Secondary | ICD-10-CM | POA: Diagnosis not present

## 2019-09-26 DIAGNOSIS — K2100 Gastro-esophageal reflux disease with esophagitis, without bleeding: Secondary | ICD-10-CM | POA: Diagnosis not present

## 2019-09-26 DIAGNOSIS — R001 Bradycardia, unspecified: Secondary | ICD-10-CM | POA: Diagnosis not present

## 2019-09-26 DIAGNOSIS — R0789 Other chest pain: Secondary | ICD-10-CM | POA: Diagnosis not present

## 2019-09-26 DIAGNOSIS — Z881 Allergy status to other antibiotic agents status: Secondary | ICD-10-CM | POA: Diagnosis not present

## 2019-09-26 DIAGNOSIS — Z880 Allergy status to penicillin: Secondary | ICD-10-CM | POA: Diagnosis not present

## 2019-09-26 DIAGNOSIS — I10 Essential (primary) hypertension: Secondary | ICD-10-CM | POA: Diagnosis not present

## 2019-09-30 DIAGNOSIS — Z299 Encounter for prophylactic measures, unspecified: Secondary | ICD-10-CM | POA: Diagnosis not present

## 2019-09-30 DIAGNOSIS — I10 Essential (primary) hypertension: Secondary | ICD-10-CM | POA: Diagnosis not present

## 2019-09-30 DIAGNOSIS — Z6824 Body mass index (BMI) 24.0-24.9, adult: Secondary | ICD-10-CM | POA: Diagnosis not present

## 2019-09-30 DIAGNOSIS — K219 Gastro-esophageal reflux disease without esophagitis: Secondary | ICD-10-CM | POA: Diagnosis not present

## 2019-09-30 DIAGNOSIS — R251 Tremor, unspecified: Secondary | ICD-10-CM | POA: Diagnosis not present

## 2019-09-30 DIAGNOSIS — R0789 Other chest pain: Secondary | ICD-10-CM | POA: Diagnosis not present

## 2019-10-04 DIAGNOSIS — J449 Chronic obstructive pulmonary disease, unspecified: Secondary | ICD-10-CM | POA: Diagnosis not present

## 2019-10-04 DIAGNOSIS — I10 Essential (primary) hypertension: Secondary | ICD-10-CM | POA: Diagnosis not present

## 2019-10-04 DIAGNOSIS — Z6824 Body mass index (BMI) 24.0-24.9, adult: Secondary | ICD-10-CM | POA: Diagnosis not present

## 2019-10-04 DIAGNOSIS — N183 Chronic kidney disease, stage 3 unspecified: Secondary | ICD-10-CM | POA: Diagnosis not present

## 2019-10-04 DIAGNOSIS — Z299 Encounter for prophylactic measures, unspecified: Secondary | ICD-10-CM | POA: Diagnosis not present

## 2019-10-04 DIAGNOSIS — M545 Low back pain: Secondary | ICD-10-CM | POA: Diagnosis not present

## 2019-10-09 DIAGNOSIS — E7800 Pure hypercholesterolemia, unspecified: Secondary | ICD-10-CM | POA: Diagnosis not present

## 2019-10-09 DIAGNOSIS — M159 Polyosteoarthritis, unspecified: Secondary | ICD-10-CM | POA: Diagnosis not present

## 2019-10-09 DIAGNOSIS — J449 Chronic obstructive pulmonary disease, unspecified: Secondary | ICD-10-CM | POA: Diagnosis not present

## 2019-10-09 DIAGNOSIS — I10 Essential (primary) hypertension: Secondary | ICD-10-CM | POA: Diagnosis not present

## 2019-10-22 DIAGNOSIS — R197 Diarrhea, unspecified: Secondary | ICD-10-CM | POA: Diagnosis not present

## 2019-10-22 DIAGNOSIS — Z6824 Body mass index (BMI) 24.0-24.9, adult: Secondary | ICD-10-CM | POA: Diagnosis not present

## 2019-10-22 DIAGNOSIS — F17210 Nicotine dependence, cigarettes, uncomplicated: Secondary | ICD-10-CM | POA: Diagnosis not present

## 2019-10-22 DIAGNOSIS — I10 Essential (primary) hypertension: Secondary | ICD-10-CM | POA: Diagnosis not present

## 2019-10-22 DIAGNOSIS — J449 Chronic obstructive pulmonary disease, unspecified: Secondary | ICD-10-CM | POA: Diagnosis not present

## 2019-10-22 DIAGNOSIS — Z299 Encounter for prophylactic measures, unspecified: Secondary | ICD-10-CM | POA: Diagnosis not present

## 2019-10-22 DIAGNOSIS — J45909 Unspecified asthma, uncomplicated: Secondary | ICD-10-CM | POA: Diagnosis not present

## 2019-10-25 DIAGNOSIS — Z6824 Body mass index (BMI) 24.0-24.9, adult: Secondary | ICD-10-CM | POA: Diagnosis not present

## 2019-10-25 DIAGNOSIS — Z299 Encounter for prophylactic measures, unspecified: Secondary | ICD-10-CM | POA: Diagnosis not present

## 2019-10-25 DIAGNOSIS — Z713 Dietary counseling and surveillance: Secondary | ICD-10-CM | POA: Diagnosis not present

## 2019-10-25 DIAGNOSIS — I10 Essential (primary) hypertension: Secondary | ICD-10-CM | POA: Diagnosis not present

## 2019-10-25 DIAGNOSIS — R1032 Left lower quadrant pain: Secondary | ICD-10-CM | POA: Diagnosis not present

## 2019-10-31 DIAGNOSIS — M879 Osteonecrosis, unspecified: Secondary | ICD-10-CM | POA: Diagnosis not present

## 2019-10-31 DIAGNOSIS — R1032 Left lower quadrant pain: Secondary | ICD-10-CM | POA: Diagnosis not present

## 2019-11-04 DIAGNOSIS — M545 Low back pain: Secondary | ICD-10-CM | POA: Diagnosis not present

## 2019-11-04 DIAGNOSIS — M87059 Idiopathic aseptic necrosis of unspecified femur: Secondary | ICD-10-CM | POA: Diagnosis not present

## 2019-11-04 DIAGNOSIS — Z299 Encounter for prophylactic measures, unspecified: Secondary | ICD-10-CM | POA: Diagnosis not present

## 2019-11-04 DIAGNOSIS — J449 Chronic obstructive pulmonary disease, unspecified: Secondary | ICD-10-CM | POA: Diagnosis not present

## 2019-11-04 DIAGNOSIS — F17210 Nicotine dependence, cigarettes, uncomplicated: Secondary | ICD-10-CM | POA: Diagnosis not present

## 2019-11-04 DIAGNOSIS — N3281 Overactive bladder: Secondary | ICD-10-CM | POA: Diagnosis not present

## 2019-11-07 DIAGNOSIS — E7800 Pure hypercholesterolemia, unspecified: Secondary | ICD-10-CM | POA: Diagnosis not present

## 2019-11-07 DIAGNOSIS — J449 Chronic obstructive pulmonary disease, unspecified: Secondary | ICD-10-CM | POA: Diagnosis not present

## 2019-11-07 DIAGNOSIS — M159 Polyosteoarthritis, unspecified: Secondary | ICD-10-CM | POA: Diagnosis not present

## 2019-11-07 DIAGNOSIS — I10 Essential (primary) hypertension: Secondary | ICD-10-CM | POA: Diagnosis not present

## 2019-12-04 DIAGNOSIS — Z6824 Body mass index (BMI) 24.0-24.9, adult: Secondary | ICD-10-CM | POA: Diagnosis not present

## 2019-12-04 DIAGNOSIS — M87059 Idiopathic aseptic necrosis of unspecified femur: Secondary | ICD-10-CM | POA: Diagnosis not present

## 2019-12-04 DIAGNOSIS — I10 Essential (primary) hypertension: Secondary | ICD-10-CM | POA: Diagnosis not present

## 2019-12-04 DIAGNOSIS — Z299 Encounter for prophylactic measures, unspecified: Secondary | ICD-10-CM | POA: Diagnosis not present

## 2019-12-04 DIAGNOSIS — J449 Chronic obstructive pulmonary disease, unspecified: Secondary | ICD-10-CM | POA: Diagnosis not present

## 2019-12-04 DIAGNOSIS — M25559 Pain in unspecified hip: Secondary | ICD-10-CM | POA: Diagnosis not present

## 2019-12-25 DIAGNOSIS — J449 Chronic obstructive pulmonary disease, unspecified: Secondary | ICD-10-CM | POA: Diagnosis not present

## 2019-12-25 DIAGNOSIS — M159 Polyosteoarthritis, unspecified: Secondary | ICD-10-CM | POA: Diagnosis not present

## 2019-12-25 DIAGNOSIS — I10 Essential (primary) hypertension: Secondary | ICD-10-CM | POA: Diagnosis not present

## 2019-12-25 DIAGNOSIS — E7800 Pure hypercholesterolemia, unspecified: Secondary | ICD-10-CM | POA: Diagnosis not present

## 2019-12-27 ENCOUNTER — Ambulatory Visit: Payer: Medicare Other | Admitting: Urology

## 2020-01-03 DIAGNOSIS — M545 Low back pain: Secondary | ICD-10-CM | POA: Diagnosis not present

## 2020-01-03 DIAGNOSIS — I10 Essential (primary) hypertension: Secondary | ICD-10-CM | POA: Diagnosis not present

## 2020-01-03 DIAGNOSIS — Z6824 Body mass index (BMI) 24.0-24.9, adult: Secondary | ICD-10-CM | POA: Diagnosis not present

## 2020-01-03 DIAGNOSIS — Z299 Encounter for prophylactic measures, unspecified: Secondary | ICD-10-CM | POA: Diagnosis not present

## 2020-01-03 DIAGNOSIS — F1120 Opioid dependence, uncomplicated: Secondary | ICD-10-CM | POA: Diagnosis not present

## 2020-01-31 DIAGNOSIS — I10 Essential (primary) hypertension: Secondary | ICD-10-CM | POA: Diagnosis not present

## 2020-01-31 DIAGNOSIS — S32040A Wedge compression fracture of fourth lumbar vertebra, initial encounter for closed fracture: Secondary | ICD-10-CM | POA: Diagnosis not present

## 2020-01-31 DIAGNOSIS — Z299 Encounter for prophylactic measures, unspecified: Secondary | ICD-10-CM | POA: Diagnosis not present

## 2020-01-31 DIAGNOSIS — N183 Chronic kidney disease, stage 3 unspecified: Secondary | ICD-10-CM | POA: Diagnosis not present

## 2020-01-31 DIAGNOSIS — J449 Chronic obstructive pulmonary disease, unspecified: Secondary | ICD-10-CM | POA: Diagnosis not present

## 2020-01-31 DIAGNOSIS — Z6824 Body mass index (BMI) 24.0-24.9, adult: Secondary | ICD-10-CM | POA: Diagnosis not present

## 2020-02-06 DIAGNOSIS — E785 Hyperlipidemia, unspecified: Secondary | ICD-10-CM | POA: Diagnosis not present

## 2020-02-06 DIAGNOSIS — M545 Low back pain: Secondary | ICD-10-CM | POA: Diagnosis not present

## 2020-02-06 DIAGNOSIS — K219 Gastro-esophageal reflux disease without esophagitis: Secondary | ICD-10-CM | POA: Diagnosis not present

## 2020-02-06 DIAGNOSIS — N3281 Overactive bladder: Secondary | ICD-10-CM | POA: Diagnosis not present

## 2020-02-06 DIAGNOSIS — J309 Allergic rhinitis, unspecified: Secondary | ICD-10-CM | POA: Diagnosis not present

## 2020-02-06 DIAGNOSIS — J449 Chronic obstructive pulmonary disease, unspecified: Secondary | ICD-10-CM | POA: Diagnosis not present

## 2020-02-06 DIAGNOSIS — I10 Essential (primary) hypertension: Secondary | ICD-10-CM | POA: Diagnosis not present

## 2020-02-06 DIAGNOSIS — F419 Anxiety disorder, unspecified: Secondary | ICD-10-CM | POA: Diagnosis not present

## 2020-02-06 DIAGNOSIS — F17210 Nicotine dependence, cigarettes, uncomplicated: Secondary | ICD-10-CM | POA: Diagnosis not present

## 2020-03-02 DIAGNOSIS — F1120 Opioid dependence, uncomplicated: Secondary | ICD-10-CM | POA: Diagnosis not present

## 2020-03-02 DIAGNOSIS — Z6824 Body mass index (BMI) 24.0-24.9, adult: Secondary | ICD-10-CM | POA: Diagnosis not present

## 2020-03-02 DIAGNOSIS — Z299 Encounter for prophylactic measures, unspecified: Secondary | ICD-10-CM | POA: Diagnosis not present

## 2020-03-02 DIAGNOSIS — M545 Low back pain: Secondary | ICD-10-CM | POA: Diagnosis not present

## 2020-03-02 DIAGNOSIS — N183 Chronic kidney disease, stage 3 unspecified: Secondary | ICD-10-CM | POA: Diagnosis not present

## 2020-03-02 DIAGNOSIS — J449 Chronic obstructive pulmonary disease, unspecified: Secondary | ICD-10-CM | POA: Diagnosis not present

## 2020-04-01 DIAGNOSIS — M545 Low back pain: Secondary | ICD-10-CM | POA: Diagnosis not present

## 2020-04-01 DIAGNOSIS — F1120 Opioid dependence, uncomplicated: Secondary | ICD-10-CM | POA: Diagnosis not present

## 2020-04-01 DIAGNOSIS — J449 Chronic obstructive pulmonary disease, unspecified: Secondary | ICD-10-CM | POA: Diagnosis not present

## 2020-04-01 DIAGNOSIS — Z299 Encounter for prophylactic measures, unspecified: Secondary | ICD-10-CM | POA: Diagnosis not present

## 2020-04-01 DIAGNOSIS — N183 Chronic kidney disease, stage 3 unspecified: Secondary | ICD-10-CM | POA: Diagnosis not present

## 2020-04-26 DIAGNOSIS — J449 Chronic obstructive pulmonary disease, unspecified: Secondary | ICD-10-CM | POA: Diagnosis not present

## 2020-04-26 DIAGNOSIS — E7800 Pure hypercholesterolemia, unspecified: Secondary | ICD-10-CM | POA: Diagnosis not present

## 2020-04-26 DIAGNOSIS — I10 Essential (primary) hypertension: Secondary | ICD-10-CM | POA: Diagnosis not present

## 2020-04-26 DIAGNOSIS — M159 Polyosteoarthritis, unspecified: Secondary | ICD-10-CM | POA: Diagnosis not present

## 2020-05-01 DIAGNOSIS — Z299 Encounter for prophylactic measures, unspecified: Secondary | ICD-10-CM | POA: Diagnosis not present

## 2020-05-01 DIAGNOSIS — M545 Low back pain: Secondary | ICD-10-CM | POA: Diagnosis not present

## 2020-05-01 DIAGNOSIS — J449 Chronic obstructive pulmonary disease, unspecified: Secondary | ICD-10-CM | POA: Diagnosis not present

## 2020-05-01 DIAGNOSIS — I10 Essential (primary) hypertension: Secondary | ICD-10-CM | POA: Diagnosis not present

## 2020-05-01 DIAGNOSIS — F411 Generalized anxiety disorder: Secondary | ICD-10-CM | POA: Diagnosis not present

## 2020-05-27 DIAGNOSIS — J449 Chronic obstructive pulmonary disease, unspecified: Secondary | ICD-10-CM | POA: Diagnosis not present

## 2020-05-27 DIAGNOSIS — M159 Polyosteoarthritis, unspecified: Secondary | ICD-10-CM | POA: Diagnosis not present

## 2020-05-27 DIAGNOSIS — E7800 Pure hypercholesterolemia, unspecified: Secondary | ICD-10-CM | POA: Diagnosis not present

## 2020-05-27 DIAGNOSIS — I10 Essential (primary) hypertension: Secondary | ICD-10-CM | POA: Diagnosis not present

## 2020-05-28 DIAGNOSIS — M545 Low back pain: Secondary | ICD-10-CM | POA: Diagnosis not present

## 2020-05-28 DIAGNOSIS — E039 Hypothyroidism, unspecified: Secondary | ICD-10-CM | POA: Diagnosis not present

## 2020-05-28 DIAGNOSIS — N183 Chronic kidney disease, stage 3 unspecified: Secondary | ICD-10-CM | POA: Diagnosis not present

## 2020-05-28 DIAGNOSIS — Z299 Encounter for prophylactic measures, unspecified: Secondary | ICD-10-CM | POA: Diagnosis not present

## 2020-05-28 DIAGNOSIS — J449 Chronic obstructive pulmonary disease, unspecified: Secondary | ICD-10-CM | POA: Diagnosis not present

## 2020-06-22 DIAGNOSIS — M545 Low back pain: Secondary | ICD-10-CM | POA: Diagnosis not present

## 2020-06-22 DIAGNOSIS — N183 Chronic kidney disease, stage 3 unspecified: Secondary | ICD-10-CM | POA: Diagnosis not present

## 2020-06-22 DIAGNOSIS — Z299 Encounter for prophylactic measures, unspecified: Secondary | ICD-10-CM | POA: Diagnosis not present

## 2020-06-22 DIAGNOSIS — R911 Solitary pulmonary nodule: Secondary | ICD-10-CM | POA: Diagnosis not present

## 2020-06-22 DIAGNOSIS — J449 Chronic obstructive pulmonary disease, unspecified: Secondary | ICD-10-CM | POA: Diagnosis not present

## 2020-06-26 DIAGNOSIS — E7800 Pure hypercholesterolemia, unspecified: Secondary | ICD-10-CM | POA: Diagnosis not present

## 2020-06-26 DIAGNOSIS — I10 Essential (primary) hypertension: Secondary | ICD-10-CM | POA: Diagnosis not present

## 2020-06-26 DIAGNOSIS — J449 Chronic obstructive pulmonary disease, unspecified: Secondary | ICD-10-CM | POA: Diagnosis not present

## 2020-06-26 DIAGNOSIS — M159 Polyosteoarthritis, unspecified: Secondary | ICD-10-CM | POA: Diagnosis not present

## 2020-06-30 DIAGNOSIS — I700 Atherosclerosis of aorta: Secondary | ICD-10-CM | POA: Diagnosis not present

## 2020-06-30 DIAGNOSIS — R918 Other nonspecific abnormal finding of lung field: Secondary | ICD-10-CM | POA: Diagnosis not present

## 2020-06-30 DIAGNOSIS — K760 Fatty (change of) liver, not elsewhere classified: Secondary | ICD-10-CM | POA: Diagnosis not present

## 2020-07-27 DIAGNOSIS — J449 Chronic obstructive pulmonary disease, unspecified: Secondary | ICD-10-CM | POA: Diagnosis not present

## 2020-07-27 DIAGNOSIS — I10 Essential (primary) hypertension: Secondary | ICD-10-CM | POA: Diagnosis not present

## 2020-07-27 DIAGNOSIS — M159 Polyosteoarthritis, unspecified: Secondary | ICD-10-CM | POA: Diagnosis not present

## 2020-07-27 DIAGNOSIS — E7800 Pure hypercholesterolemia, unspecified: Secondary | ICD-10-CM | POA: Diagnosis not present

## 2020-07-30 DIAGNOSIS — E039 Hypothyroidism, unspecified: Secondary | ICD-10-CM | POA: Diagnosis not present

## 2020-07-30 DIAGNOSIS — R5383 Other fatigue: Secondary | ICD-10-CM | POA: Diagnosis not present

## 2020-07-30 DIAGNOSIS — Z7189 Other specified counseling: Secondary | ICD-10-CM | POA: Diagnosis not present

## 2020-07-30 DIAGNOSIS — Z1339 Encounter for screening examination for other mental health and behavioral disorders: Secondary | ICD-10-CM | POA: Diagnosis not present

## 2020-07-30 DIAGNOSIS — Z0000 Encounter for general adult medical examination without abnormal findings: Secondary | ICD-10-CM | POA: Diagnosis not present

## 2020-07-30 DIAGNOSIS — Z1331 Encounter for screening for depression: Secondary | ICD-10-CM | POA: Diagnosis not present

## 2020-07-30 DIAGNOSIS — I10 Essential (primary) hypertension: Secondary | ICD-10-CM | POA: Diagnosis not present

## 2020-07-30 DIAGNOSIS — Z299 Encounter for prophylactic measures, unspecified: Secondary | ICD-10-CM | POA: Diagnosis not present

## 2020-08-05 DIAGNOSIS — Z79899 Other long term (current) drug therapy: Secondary | ICD-10-CM | POA: Diagnosis not present

## 2020-08-05 DIAGNOSIS — R5383 Other fatigue: Secondary | ICD-10-CM | POA: Diagnosis not present

## 2020-08-05 DIAGNOSIS — Z125 Encounter for screening for malignant neoplasm of prostate: Secondary | ICD-10-CM | POA: Diagnosis not present

## 2020-08-05 DIAGNOSIS — E7800 Pure hypercholesterolemia, unspecified: Secondary | ICD-10-CM | POA: Diagnosis not present

## 2020-08-05 DIAGNOSIS — E039 Hypothyroidism, unspecified: Secondary | ICD-10-CM | POA: Diagnosis not present

## 2020-08-10 DIAGNOSIS — M545 Low back pain: Secondary | ICD-10-CM | POA: Diagnosis not present

## 2020-08-10 DIAGNOSIS — M25552 Pain in left hip: Secondary | ICD-10-CM | POA: Diagnosis not present

## 2020-08-15 DIAGNOSIS — Z20822 Contact with and (suspected) exposure to covid-19: Secondary | ICD-10-CM | POA: Diagnosis not present

## 2020-08-17 DIAGNOSIS — R748 Abnormal levels of other serum enzymes: Secondary | ICD-10-CM | POA: Diagnosis not present

## 2020-08-20 DIAGNOSIS — R4182 Altered mental status, unspecified: Secondary | ICD-10-CM | POA: Diagnosis not present

## 2020-08-20 DIAGNOSIS — I10 Essential (primary) hypertension: Secondary | ICD-10-CM | POA: Diagnosis not present

## 2020-08-20 DIAGNOSIS — R569 Unspecified convulsions: Secondary | ICD-10-CM | POA: Diagnosis not present

## 2020-08-20 DIAGNOSIS — E876 Hypokalemia: Secondary | ICD-10-CM | POA: Diagnosis not present

## 2020-08-20 DIAGNOSIS — E871 Hypo-osmolality and hyponatremia: Secondary | ICD-10-CM | POA: Diagnosis not present

## 2020-08-20 DIAGNOSIS — E860 Dehydration: Secondary | ICD-10-CM | POA: Diagnosis not present

## 2020-08-20 DIAGNOSIS — Z5329 Procedure and treatment not carried out because of patient's decision for other reasons: Secondary | ICD-10-CM | POA: Diagnosis not present

## 2020-08-20 DIAGNOSIS — F419 Anxiety disorder, unspecified: Secondary | ICD-10-CM | POA: Diagnosis not present

## 2020-08-20 DIAGNOSIS — F10929 Alcohol use, unspecified with intoxication, unspecified: Secondary | ICD-10-CM | POA: Diagnosis not present

## 2020-08-20 DIAGNOSIS — R55 Syncope and collapse: Secondary | ICD-10-CM | POA: Diagnosis not present

## 2020-08-20 DIAGNOSIS — R410 Disorientation, unspecified: Secondary | ICD-10-CM | POA: Diagnosis not present

## 2020-08-20 DIAGNOSIS — E785 Hyperlipidemia, unspecified: Secondary | ICD-10-CM | POA: Diagnosis not present

## 2020-08-20 DIAGNOSIS — W19XXXA Unspecified fall, initial encounter: Secondary | ICD-10-CM | POA: Diagnosis not present

## 2020-08-25 DIAGNOSIS — J449 Chronic obstructive pulmonary disease, unspecified: Secondary | ICD-10-CM | POA: Diagnosis not present

## 2020-08-25 DIAGNOSIS — N183 Chronic kidney disease, stage 3 unspecified: Secondary | ICD-10-CM | POA: Diagnosis not present

## 2020-08-25 DIAGNOSIS — Z299 Encounter for prophylactic measures, unspecified: Secondary | ICD-10-CM | POA: Diagnosis not present

## 2020-08-25 DIAGNOSIS — F1120 Opioid dependence, uncomplicated: Secondary | ICD-10-CM | POA: Diagnosis not present

## 2020-08-25 DIAGNOSIS — R55 Syncope and collapse: Secondary | ICD-10-CM | POA: Diagnosis not present

## 2020-08-25 DIAGNOSIS — I10 Essential (primary) hypertension: Secondary | ICD-10-CM | POA: Diagnosis not present

## 2020-08-27 DIAGNOSIS — I10 Essential (primary) hypertension: Secondary | ICD-10-CM | POA: Diagnosis not present

## 2020-08-27 DIAGNOSIS — J449 Chronic obstructive pulmonary disease, unspecified: Secondary | ICD-10-CM | POA: Diagnosis not present

## 2020-08-27 DIAGNOSIS — M159 Polyosteoarthritis, unspecified: Secondary | ICD-10-CM | POA: Diagnosis not present

## 2020-08-27 DIAGNOSIS — E7800 Pure hypercholesterolemia, unspecified: Secondary | ICD-10-CM | POA: Diagnosis not present

## 2020-09-07 DIAGNOSIS — R55 Syncope and collapse: Secondary | ICD-10-CM | POA: Diagnosis not present

## 2020-09-15 DIAGNOSIS — I10 Essential (primary) hypertension: Secondary | ICD-10-CM | POA: Diagnosis not present

## 2020-09-15 DIAGNOSIS — Z299 Encounter for prophylactic measures, unspecified: Secondary | ICD-10-CM | POA: Diagnosis not present

## 2020-09-15 DIAGNOSIS — R350 Frequency of micturition: Secondary | ICD-10-CM | POA: Diagnosis not present

## 2020-09-15 DIAGNOSIS — M25511 Pain in right shoulder: Secondary | ICD-10-CM | POA: Diagnosis not present

## 2020-09-15 DIAGNOSIS — M79604 Pain in right leg: Secondary | ICD-10-CM | POA: Diagnosis not present

## 2020-09-21 DIAGNOSIS — I739 Peripheral vascular disease, unspecified: Secondary | ICD-10-CM | POA: Diagnosis not present

## 2020-09-25 DIAGNOSIS — M159 Polyosteoarthritis, unspecified: Secondary | ICD-10-CM | POA: Diagnosis not present

## 2020-09-25 DIAGNOSIS — E7800 Pure hypercholesterolemia, unspecified: Secondary | ICD-10-CM | POA: Diagnosis not present

## 2020-09-25 DIAGNOSIS — I10 Essential (primary) hypertension: Secondary | ICD-10-CM | POA: Diagnosis not present

## 2020-09-25 DIAGNOSIS — J449 Chronic obstructive pulmonary disease, unspecified: Secondary | ICD-10-CM | POA: Diagnosis not present

## 2020-09-28 DIAGNOSIS — Z299 Encounter for prophylactic measures, unspecified: Secondary | ICD-10-CM | POA: Diagnosis not present

## 2020-09-28 DIAGNOSIS — E039 Hypothyroidism, unspecified: Secondary | ICD-10-CM | POA: Diagnosis not present

## 2020-09-28 DIAGNOSIS — J449 Chronic obstructive pulmonary disease, unspecified: Secondary | ICD-10-CM | POA: Diagnosis not present

## 2020-09-28 DIAGNOSIS — M545 Low back pain, unspecified: Secondary | ICD-10-CM | POA: Diagnosis not present

## 2020-09-28 DIAGNOSIS — N183 Chronic kidney disease, stage 3 unspecified: Secondary | ICD-10-CM | POA: Diagnosis not present

## 2020-10-01 DIAGNOSIS — Z86010 Personal history of colonic polyps: Secondary | ICD-10-CM | POA: Diagnosis not present

## 2020-10-01 DIAGNOSIS — D126 Benign neoplasm of colon, unspecified: Secondary | ICD-10-CM | POA: Diagnosis not present

## 2020-10-06 ENCOUNTER — Ambulatory Visit: Payer: Medicare Other | Admitting: Urology

## 2020-10-06 NOTE — Progress Notes (Incomplete)
H&P  Chief Complaint: Urinary Frequency  History of Present Illness: Todd Hodge is a new patient referred for urinary frequency.  Past Medical History:  Diagnosis Date  . Anxiety disorder   . Avascular necrosis of femur head, left ([place])   . Essential hypertension, benign   . GERD (gastroesophageal reflux disease)   . History of cardiac catheterization    Todd Hodge 2000 - no reported significant CAD  . Mitral regurgitation    Mild, echo, 02/2012  . Mixed hyperlipidemia   . Palpitations   . Seasonal allergies   . Tobacco abuse     Past Surgical History:  Procedure Laterality Date  . BACK SURGERY     May 2012  . IR KYPHO LUMBAR INC FX REDUCE BONE BX UNI/BIL CANNULATION INC/IMAGING  05/24/2018  . NOSE SURGERY     1988    Home Medications:  Allergies as of 10/06/2020      Reactions   Penicillins Other (See Comments)   Happened in [place], high temperature - Can take Amoxicillin Has patient had a PCN reaction causing immediate rash, facial/tongue/throat swelling, SOB or lightheadedness with hypotension: Unknown Has patient had a PCN reaction causing severe rash involving mucus membranes or skin necrosis: Unknown Has patient had a PCN reaction that required hospitalization: Unknown Has patient had a PCN reaction occurring within the last 10 years: No If all of the above answers are "NO", then may proceed with Cep   Doxycycline Rash      Medication List       Accurate as of October 06, 2020 12:49 PM. If you have any questions, ask your nurse or doctor.        aspirin EC 81 MG tablet Take 1 tablet (81 mg total) by mouth daily.   atenolol 50 MG tablet Commonly known as: TENORMIN Take 50 mg by mouth daily.   cetirizine 10 MG tablet Commonly known as: ZYRTEC Take 10 mg by mouth at bedtime.   diazepam 5 MG tablet Commonly known as: VALIUM Take 5 mg by mouth 3 (three) times daily as needed for anxiety.   esomeprazole 40 MG capsule Commonly known as: NEXIUM Take  40 mg by mouth 2 (two) times daily.   lovastatin 20 MG tablet Commonly known as: MEVACOR Take 20 mg by mouth at bedtime.   oxyCODONE-acetaminophen 10-325 MG tablet Commonly known as: PERCOCET Take 1 tablet by mouth 4 times daily as needed for pain.   ProAir HFA 108 (90 Base) MCG/ACT inhaler Generic drug: albuterol Inhale 2 puffs into the lungs every 6 (six) hours as needed for wheezing or shortness of breath.   tamsulosin 0.4 MG Caps capsule Commonly known as: FLOMAX Take 0.4 mg by mouth daily.       Allergies:  Allergies  Allergen Reactions  . Penicillins Other (See Comments)    Happened in [place], high temperature - Can take Amoxicillin Has patient had a PCN reaction causing immediate rash, facial/tongue/throat swelling, SOB or lightheadedness with hypotension: Unknown Has patient had a PCN reaction causing severe rash involving mucus membranes or skin necrosis: Unknown Has patient had a PCN reaction that required hospitalization: Unknown Has patient had a PCN reaction occurring within the last 10 years: No If all of the above answers are "NO", then may proceed with Cep  . Doxycycline Rash    Family History  Problem Relation Age of Onset  . Heart failure Mother     Social History:  reports that he has been smoking cigarettes. He  has a 25.00 pack-year smoking history. He has never used smokeless tobacco. He reports that he does not drink alcohol and does not use drugs.  ROS: A complete review of systems was performed.  All systems are negative except for pertinent findings as noted.  Physical Exam:  Vital signs in last 24 hours: There were no vitals taken for this visit. Constitutional:  Alert and oriented, No acute distress Cardiovascular: Regular rate  Respiratory: Normal respiratory effort GI: Abdomen is soft, nontender, nondistended, no abdominal masses. No CVAT.  Genitourinary: Normal male phallus, testes are descended bilaterally and non-tender and without  masses, scrotum is normal in appearance without lesions or masses, perineum is normal on inspection. Lymphatic: No lymphadenopathy Neurologic: Grossly intact, no focal deficits Psychiatric: Normal mood and affect  Laboratory Data:  No results for input(s): WBC, HGB, HCT, PLT in the last 72 hours.  No results for input(s): NA, K, CL, GLUCOSE, BUN, CALCIUM, CREATININE in the last 72 hours.  Invalid input(s): CO3   No results found for this or any previous visit (from the past 24 hour(s)). No results found for this or any previous visit (from the past 240 hour(s)).  Renal Function: No results for input(s): CREATININE in the last 168 hours. CrCl cannot be calculated (Patient's most recent lab result is older than the maximum 21 days allowed.).  Radiologic Imaging: No results found.  Impression/Assessment:  ***  Plan:  ***

## 2020-10-27 DIAGNOSIS — M159 Polyosteoarthritis, unspecified: Secondary | ICD-10-CM | POA: Diagnosis not present

## 2020-10-27 DIAGNOSIS — I10 Essential (primary) hypertension: Secondary | ICD-10-CM | POA: Diagnosis not present

## 2020-10-27 DIAGNOSIS — E7800 Pure hypercholesterolemia, unspecified: Secondary | ICD-10-CM | POA: Diagnosis not present

## 2020-10-27 DIAGNOSIS — J449 Chronic obstructive pulmonary disease, unspecified: Secondary | ICD-10-CM | POA: Diagnosis not present

## 2020-10-28 DIAGNOSIS — Z299 Encounter for prophylactic measures, unspecified: Secondary | ICD-10-CM | POA: Diagnosis not present

## 2020-10-28 DIAGNOSIS — F1120 Opioid dependence, uncomplicated: Secondary | ICD-10-CM | POA: Diagnosis not present

## 2020-10-28 DIAGNOSIS — I10 Essential (primary) hypertension: Secondary | ICD-10-CM | POA: Diagnosis not present

## 2020-10-28 DIAGNOSIS — M545 Low back pain, unspecified: Secondary | ICD-10-CM | POA: Diagnosis not present

## 2020-10-28 DIAGNOSIS — J449 Chronic obstructive pulmonary disease, unspecified: Secondary | ICD-10-CM | POA: Diagnosis not present

## 2020-10-29 ENCOUNTER — Ambulatory Visit: Payer: Medicare Other | Admitting: Neurology

## 2020-11-04 DIAGNOSIS — R911 Solitary pulmonary nodule: Secondary | ICD-10-CM | POA: Diagnosis not present

## 2020-11-09 ENCOUNTER — Ambulatory Visit: Payer: Medicare Other | Admitting: Neurology

## 2020-11-09 ENCOUNTER — Encounter: Payer: Self-pay | Admitting: Neurology

## 2020-11-10 DIAGNOSIS — I700 Atherosclerosis of aorta: Secondary | ICD-10-CM | POA: Diagnosis not present

## 2020-11-10 DIAGNOSIS — Z299 Encounter for prophylactic measures, unspecified: Secondary | ICD-10-CM | POA: Diagnosis not present

## 2020-11-10 DIAGNOSIS — K3189 Other diseases of stomach and duodenum: Secondary | ICD-10-CM | POA: Diagnosis not present

## 2020-11-10 DIAGNOSIS — J449 Chronic obstructive pulmonary disease, unspecified: Secondary | ICD-10-CM | POA: Diagnosis not present

## 2020-11-10 DIAGNOSIS — R911 Solitary pulmonary nodule: Secondary | ICD-10-CM | POA: Diagnosis not present

## 2020-11-12 ENCOUNTER — Encounter: Payer: Self-pay | Admitting: Internal Medicine

## 2020-11-26 DIAGNOSIS — F411 Generalized anxiety disorder: Secondary | ICD-10-CM | POA: Diagnosis not present

## 2020-11-26 DIAGNOSIS — R1032 Left lower quadrant pain: Secondary | ICD-10-CM | POA: Diagnosis not present

## 2020-11-26 DIAGNOSIS — I10 Essential (primary) hypertension: Secondary | ICD-10-CM | POA: Diagnosis not present

## 2020-11-26 DIAGNOSIS — M545 Low back pain, unspecified: Secondary | ICD-10-CM | POA: Diagnosis not present

## 2020-11-26 DIAGNOSIS — Z299 Encounter for prophylactic measures, unspecified: Secondary | ICD-10-CM | POA: Diagnosis not present

## 2020-12-03 ENCOUNTER — Ambulatory Visit (INDEPENDENT_AMBULATORY_CARE_PROVIDER_SITE_OTHER): Payer: Medicare PPO | Admitting: Gastroenterology

## 2020-12-03 ENCOUNTER — Telehealth: Payer: Self-pay | Admitting: Internal Medicine

## 2020-12-03 ENCOUNTER — Encounter: Payer: Self-pay | Admitting: Gastroenterology

## 2020-12-03 ENCOUNTER — Other Ambulatory Visit: Payer: Self-pay

## 2020-12-03 DIAGNOSIS — D126 Benign neoplasm of colon, unspecified: Secondary | ICD-10-CM | POA: Diagnosis not present

## 2020-12-03 DIAGNOSIS — K5900 Constipation, unspecified: Secondary | ICD-10-CM | POA: Diagnosis not present

## 2020-12-03 DIAGNOSIS — K3189 Other diseases of stomach and duodenum: Secondary | ICD-10-CM

## 2020-12-03 NOTE — Telephone Encounter (Signed)
Pt was seen in office this morning. His wife called to say that [facility] hasn't received his prescription yet.

## 2020-12-03 NOTE — Patient Instructions (Signed)
For constipation: I would like to start Linzess. Start taking Linzess 1 capsule 30 minutes before breakfast daily. It is normal to have some looser stool starting out for the first few days, but this should improve. If it does not, please call us, as we will need to adjust the dosage. We can go up or down if needed!  Continue Nexium once daily.  Please call if persistent abdominal discomfort despite your bowels moving better.  We are arranging a colonoscopy and upper endoscopy in the near future with Dr. Marletta Lor!   Further recommendations to follow!  It was a pleasure to see you today. I want to create trusting relationships with patients to provide genuine, compassionate, and quality care. I value your feedback. If you receive a survey regarding your visit,  I greatly appreciate you taking time to fill this out.   Gelene Mink, PhD, ANP-BC [facility]

## 2020-12-03 NOTE — Progress Notes (Signed)
Primary Care Physician:  Ignatius Specking, MD  Referring Physician: Dr. Sherril Croon Primary Gastroenterologist:  Dr. Marletta Lor  Chief Complaint  Patient presents with   gastric wall thickening    Shown on CT. Consult EGD    HPI:   Todd Hodge is a 70 y.o. male presenting today at the request of Dr. Sherril Croon due to abnormal CT chest with contrast Dec 2021 that showed questionable gastric wall thickening vs artifact from underdistension. Stable pulmonary nodules that are being followed. History of adenomas, one of which was sessile serrated adenoma in 2017 by Dr. Marcha Solders.   Very frequent urination. Has Urology appt upcoming. Isolated event 6 months ago where he passed out in grocery store and was told he was dehydrated. No other episodes.   Will occasionally have spells of diarrhea.Goes 3-4 days without BM. Has to strain to have a BM. No rectal bleeding. Notes suprapubic and abdominal pain at level of umbilicus for past few months. Pain eased off after BM. No weight loss. Stays around 160 lbs. No dysphagia.   Nexium once daily.   Past Medical History:  Diagnosis Date   Anxiety disorder    Avascular necrosis of femur head, left (HCC)    Essential hypertension, benign    GERD (gastroesophageal reflux disease)    History of cardiac catheterization    [place] 2000 - no reported significant CAD   Mitral regurgitation    Mild, echo, 02/2012   Mixed hyperlipidemia    Palpitations    Seasonal allergies    Tobacco abuse     Past Surgical History:  Procedure Laterality Date   BACK SURGERY     May 2012   COLONOSCOPY  09/2016   Dr. Marcha Solders in [place]: three polyps, (2 were tubular adenomas and 1 sessile serrated adenoma).    IR KYPHO LUMBAR INC FX REDUCE BONE BX UNI/BIL CANNULATION INC/IMAGING  05/24/2018   NOSE SURGERY     1988    Current Outpatient Medications  Medication Sig Dispense Refill   albuterol (VENTOLIN HFA) 108 (90 Base) MCG/ACT inhaler Inhale 2 puffs into  the lungs every 6 (six) hours as needed for wheezing or shortness of breath.      aspirin EC 81 MG tablet Take 1 tablet (81 mg total) by mouth daily. (Patient taking differently: Take 81 mg by mouth daily. When he remembers)     atenolol (TENORMIN) 50 MG tablet Take 50 mg by mouth daily.      diazepam (VALIUM) 5 MG tablet Take 5 mg by mouth 3 (three) times daily as needed for anxiety.      dutasteride (AVODART) 0.5 MG capsule Take 0.5 mg by mouth daily.     esomeprazole (NEXIUM) 40 MG capsule Take 40 mg by mouth daily before breakfast.     lisinopril (ZESTRIL) 10 MG tablet Take 10 mg by mouth daily.     loratadine (CLARITIN) 10 MG tablet Take 10 mg by mouth daily.     lovastatin (MEVACOR) 20 MG tablet Take 20 mg by mouth at bedtime.     oxyCODONE-acetaminophen (PERCOCET) 10-325 MG per tablet Take 1 tablet by mouth 4 times daily as needed for pain.      tamsulosin (FLOMAX) 0.4 MG CAPS capsule Take 0.4 mg by mouth daily.     No current facility-administered medications for this visit.    Allergies as of 12/03/2020 - Review Complete 12/03/2020  Allergen Reaction Noted   Penicillins Other (See Comments) 03/07/2012  Doxycycline Rash 03/07/2012    Family History  Problem Relation Age of Onset   Heart failure Mother    Colon cancer Neg Hx     Social History   Socioeconomic History   Marital status: Married    Spouse name: Not on file   Number of children: Not on file   Years of education: Not on file   Highest education level: Not on file  Occupational History   Occupation: Disabled    Comment: Sales  Tobacco Use   Smoking status: Former Smoker    Packs/day: 1.00    Years: 25.00    Pack years: 25.00    Types: Cigarettes   Smokeless tobacco: Never Used   Tobacco comment: can't remember how long ago he smoked  Vaping Use   Vaping Use: Never used  Substance and Sexual Activity   Alcohol use: No    Comment: history of ETOH use in past   Drug use: No    Sexual activity: Not on file  Other Topics Concern   Not on file  Social History Narrative   Not on file   Social Determinants of Health   Financial Resource Strain: Not on file  Food Insecurity: Not on file  Transportation Needs: Not on file  Physical Activity: Not on file  Stress: Not on file  Social Connections: Not on file  Intimate Partner Violence: Not on file    Review of Systems: Gen: Denies any fever, chills, fatigue, weight loss, lack of appetite.  CV: Denies chest pain, heart palpitations, peripheral edema, syncope.  Resp: Denies shortness of breath at rest or with exertion. Denies wheezing or cough.  GI: see HPI GU : Denies urinary burning, urinary frequency, urinary hesitancy MS: Denies joint pain, muscle weakness, cramps, or limitation of movement.  Derm: Denies rash, itching, dry skin Psych: Denies depression, anxiety, memory loss, and confusion Heme: Denies bruising, bleeding, and enlarged lymph nodes.  Physical Exam: BP 115/67    Pulse (!) 54    Temp (!) 97.1 F (36.2 C)    Ht 5\' 9"  (1.753 m)    Wt 161 lb 12.8 oz (73.4 kg)    BMI 23.89 kg/m  General:   Alert and oriented. Pleasant and cooperative. Well-nourished and well-developed.  Head:  Normocephalic and atraumatic. Eyes:  Without icterus, sclera clear and conjunctiva pink.  Ears:  Normal auditory acuity. Mouth:  Mask in place Lungs:  Clear to auscultation bilaterally. No wheezes, rales, or rhonchi. No distress.  Heart:  S1, S2 present without murmurs appreciated.  Abdomen:  +BS, soft, non-tender and non-distended. No HSM noted. No guarding or rebound. No masses appreciated.  Rectal:  Deferred  Msk:  Symmetrical without gross deformities. Normal posture. Extremities:  Without edema. Neurologic:  Alert and  oriented x4;  grossly normal neurologically. Skin:  Intact without significant lesions or rashes. Psych:  Alert and cooperative. Normal mood and affect.  ASSESSMENT: Todd Hodge is a 70  y.o. male presenting today with abnormal CT chest with contrast Dec 2021 that showed questionable gastric wall thickening vs artifact from underdistension. Stable pulmonary nodules that are being followed. History of adenomas, one of which was sessile serrated adenoma in 2017 by Dr. Marcha Soldersathey. Overdue for surveillance colonoscopy.   Chronic constipation not ideally managed. Will trial Linzess 145 mcg samples once daily.   Will pursue colonoscopy due to history of polyps and EGD for diagnostic purposes due to abnormal CT. Abdominal discomfort appears moreso related to constipation with relief thereafter.  However, if pursues despite more aggressive bowel regimen, we will order CT abd/pelvis.     PLAN:  Proceed with colonoscopy/EGD by Dr. Abbey Chatters  in near future: the risks, benefits, and alternatives have been discussed with the patient in detail. The patient states understanding and desires to proceed.   Linzess 145 mcg samples  Continue Nexium once daily  Call if persistent abdominal discomfort and will pursue CT in interim  Keep upcoming Urology appt   Annitta Needs, PhD, ANP-BC [facility]

## 2020-12-03 NOTE — Telephone Encounter (Signed)
Phoned the pt back and he was not given samples this morning. Put 4 boxes of Linzess 145 mcg samples up front for the pt to try before he asks for Rx. Instructions were given to use and that we close at 12 noon tomorrow. (pt will pick up tomorrow).

## 2020-12-04 ENCOUNTER — Telehealth: Payer: Self-pay | Admitting: Internal Medicine

## 2020-12-04 NOTE — Telephone Encounter (Addendum)
Noted. Placed on cancellation list.   PA was started yesterday via [employer] website. TCS tracking# [id number] is pending final authorization. EGD tracking# [id number] is awaiting clinical review, clinical notes to be uploaded. Awaiting OV note to be completed. Offered pt at [facility] to have procedure 12/29/20 but he said he couldn't do that day because of urology appt. Roseanne Kaufman NP was informed.

## 2020-12-04 NOTE — Telephone Encounter (Signed)
Patient asked to be put on a cancellation list for his procedure

## 2020-12-07 ENCOUNTER — Other Ambulatory Visit: Payer: Self-pay

## 2020-12-07 ENCOUNTER — Telehealth: Payer: Self-pay

## 2020-12-07 ENCOUNTER — Encounter: Payer: Self-pay | Admitting: Gastroenterology

## 2020-12-07 NOTE — Telephone Encounter (Signed)
TCS/EGD approved. [employer]# [id number], valid 01/05/21-02/04/21.

## 2020-12-08 DIAGNOSIS — J449 Chronic obstructive pulmonary disease, unspecified: Secondary | ICD-10-CM | POA: Diagnosis not present

## 2020-12-08 DIAGNOSIS — I10 Essential (primary) hypertension: Secondary | ICD-10-CM | POA: Diagnosis not present

## 2020-12-08 DIAGNOSIS — N183 Chronic kidney disease, stage 3 unspecified: Secondary | ICD-10-CM | POA: Diagnosis not present

## 2020-12-08 DIAGNOSIS — Z299 Encounter for prophylactic measures, unspecified: Secondary | ICD-10-CM | POA: Diagnosis not present

## 2020-12-08 DIAGNOSIS — R350 Frequency of micturition: Secondary | ICD-10-CM | POA: Diagnosis not present

## 2020-12-08 NOTE — Progress Notes (Signed)
Cc'ed to pcp °

## 2020-12-24 DIAGNOSIS — I10 Essential (primary) hypertension: Secondary | ICD-10-CM | POA: Diagnosis not present

## 2020-12-24 DIAGNOSIS — F1120 Opioid dependence, uncomplicated: Secondary | ICD-10-CM | POA: Diagnosis not present

## 2020-12-24 DIAGNOSIS — M545 Low back pain, unspecified: Secondary | ICD-10-CM | POA: Diagnosis not present

## 2020-12-24 DIAGNOSIS — Z299 Encounter for prophylactic measures, unspecified: Secondary | ICD-10-CM | POA: Diagnosis not present

## 2020-12-24 DIAGNOSIS — J449 Chronic obstructive pulmonary disease, unspecified: Secondary | ICD-10-CM | POA: Diagnosis not present

## 2020-12-28 ENCOUNTER — Ambulatory Visit: Payer: Medicare Other | Admitting: Gastroenterology

## 2020-12-29 ENCOUNTER — Other Ambulatory Visit: Payer: Self-pay

## 2020-12-29 ENCOUNTER — Ambulatory Visit (INDEPENDENT_AMBULATORY_CARE_PROVIDER_SITE_OTHER): Payer: Medicare PPO | Admitting: Urology

## 2020-12-29 ENCOUNTER — Encounter: Payer: Self-pay | Admitting: Urology

## 2020-12-29 VITALS — BP 108/66 | HR 66 | Temp 98.2°F | Ht 69.0 in | Wt 160.0 lb

## 2020-12-29 DIAGNOSIS — R351 Nocturia: Secondary | ICD-10-CM | POA: Diagnosis not present

## 2020-12-29 DIAGNOSIS — N138 Other obstructive and reflux uropathy: Secondary | ICD-10-CM

## 2020-12-29 DIAGNOSIS — N401 Enlarged prostate with lower urinary tract symptoms: Secondary | ICD-10-CM

## 2020-12-29 DIAGNOSIS — R350 Frequency of micturition: Secondary | ICD-10-CM

## 2020-12-29 LAB — URINALYSIS, ROUTINE W REFLEX MICROSCOPIC
Bilirubin, UA: NEGATIVE
Glucose, UA: NEGATIVE
Ketones, UA: NEGATIVE
Leukocytes,UA: NEGATIVE
Nitrite, UA: NEGATIVE
Protein,UA: NEGATIVE
RBC, UA: NEGATIVE
Specific Gravity, UA: 1.01 (ref 1.005–1.030)
Urobilinogen, Ur: 0.2 mg/dL (ref 0.2–1.0)
pH, UA: 5.5 (ref 5.0–7.5)

## 2020-12-29 NOTE — Progress Notes (Signed)
H&P  Chief Complaint: Urinary Frequency  History of Present Illness: Todd Hodge is a 70 year old new patient here today to be seen for his urinary frequency/BPH. He continues on dutasteride & tamsulosin (B.i.d.) with inadequate response. He reports his primary concern as urinary frequency and small volume per void. He has a weakened FOS and is experiencing intermittency. He denies any fhx prostate cancer. He does experience constipation.  IPSS Questionnaire (AUA-7): Over the past month.   1)  How often have you had a sensation of not emptying your bladder completely after you finish urinating?  3 - About half the time  2)  How often have you had to urinate again less than two hours after you finished urinating? 2 - Less than half the time  3)  How often have you found you stopped and started again several times when you urinated?  2 - Less than half the time  4) How difficult have you found it to postpone urination?  2 - Less than half the time  5) How often have you had a weak urinary stream?  3 - About half the time  6) How often have you had to push or strain to begin urination?  2 - Less than half the time  7) How many times did you most typically get up to urinate from the time you went to bed until the time you got up in the morning?  3 - 3 times  Total score:  0-7 mildly symptomatic   8-19 moderately symptomatic   20-35 severely symptomatic  IPSS: 17    Past Medical History:  Diagnosis Date  . Anxiety disorder   . Avascular necrosis of femur head, left ([place])   . Essential hypertension, benign   . GERD (gastroesophageal reflux disease)   . History of cardiac catheterization    [place] 2000 - no reported significant CAD  . Mitral regurgitation    Mild, echo, 02/2012  . Mixed hyperlipidemia   . Palpitations   . Seasonal allergies   . Tobacco abuse     Past Surgical History:  Procedure Laterality Date  . BACK SURGERY     May 2012  . COLONOSCOPY  09/2016   Dr. Ladona Horns in  [place]: three polyps, (2 were tubular adenomas and 1 sessile serrated adenoma).   . IR KYPHO LUMBAR INC FX REDUCE BONE BX UNI/BIL CANNULATION INC/IMAGING  05/24/2018  . NOSE SURGERY     1988    Home Medications:  Allergies as of 12/29/2020      Reactions   Penicillins Other (See Comments)   Happened in [place], high temperature - Can take Amoxicillin Has patient had a PCN reaction causing immediate rash, facial/tongue/throat swelling, SOB or lightheadedness with hypotension: Unknown Has patient had a PCN reaction causing severe rash involving mucus membranes or skin necrosis: Unknown Has patient had a PCN reaction that required hospitalization: Unknown Has patient had a PCN reaction occurring within the last 10 years: No If all of the above answers are "NO", then may proceed with Cep   Doxycycline Rash      Medication List       Accurate as of December 29, 2020 12:02 PM. If you have any questions, ask your nurse or doctor.        albuterol 108 (90 Base) MCG/ACT inhaler Commonly known as: VENTOLIN HFA Inhale 2 puffs into the lungs every 6 (six) hours as needed for wheezing or shortness of breath.   aspirin EC 81  MG tablet Take 1 tablet (81 mg total) by mouth daily.   atenolol 50 MG tablet Commonly known as: TENORMIN Take 50 mg by mouth daily.   diazepam 5 MG tablet Commonly known as: VALIUM Take 5 mg by mouth 3 (three) times daily as needed for anxiety.   diclofenac Sodium 1 % Gel Commonly known as: VOLTAREN Apply 1 application topically 4 (four) times daily as needed (sternum pain).   dutasteride 0.5 MG capsule Commonly known as: AVODART Take 0.5 mg by mouth at bedtime.   esomeprazole 40 MG capsule Commonly known as: NEXIUM Take 40 mg by mouth in the morning and at bedtime.   lisinopril 20 MG tablet Commonly known as: ZESTRIL Take 20 mg by mouth every evening.   loratadine 10 MG tablet Commonly known as: CLARITIN Take 10 mg by mouth every evening.   lovastatin  20 MG tablet Commonly known as: MEVACOR Take 20 mg by mouth at bedtime.   Oxycodone HCl 10 MG Tabs Take 10 mg by mouth 4 (four) times daily as needed for pain.   tamsulosin 0.4 MG Caps capsule Commonly known as: FLOMAX Take 0.4 mg by mouth in the morning and at bedtime.       Allergies:  Allergies  Allergen Reactions  . Penicillins Other (See Comments)    Happened in [employer], high temperature - Can take Amoxicillin Has patient had a PCN reaction causing immediate rash, facial/tongue/throat swelling, SOB or lightheadedness with hypotension: Unknown Has patient had a PCN reaction causing severe rash involving mucus membranes or skin necrosis: Unknown Has patient had a PCN reaction that required hospitalization: Unknown Has patient had a PCN reaction occurring within the last 10 years: No If all of the above answers are "NO", then may proceed with Cep  . Doxycycline Rash    Family History  Problem Relation Age of Onset  . Heart failure Mother   . Colon cancer Neg Hx     Social History:  reports that he has quit smoking. His smoking use included cigarettes. He has a 25.00 pack-year smoking history. He has never used smokeless tobacco. He reports that he does not drink alcohol and does not use drugs.  ROS: A complete review of systems was performed.  All systems are negative except for pertinent findings as noted.  Physical Exam:  Vital signs in last 24 hours: There were no vitals taken for this visit. Constitutional:  Alert and oriented, No acute distress, limited ambulation (cane). Talkative. Cardiovascular: Regular rate  Respiratory: Normal respiratory effort GI: Abdomen is soft, nontender, nondistended, no abdominal masses. No CVAT. No hernias Genitourinary: Normal male phallus, testes are descended bilaterally and non-tender and without masses, scrotum is normal in appearance without lesions or masses, perineum is normal on inspection. Prostate feels about 30  grams. Neurologic: Grossly intact, no focal deficits Psychiatric: Normal mood and affect  I have reviewed prior pt notes  I have reviewed notes from referring/previous physicians  I have reviewed urinalysis results  I have independently reviewed prior imaging/pv u/s    Impression/Assessment:  BPH w/ LUTS: Pt has persistent symptoms despite maximum pharmaceutical therapy. Symptoms may be exacerbated by constipation. Will treat constipation and recheck symptoms.  Plan:  1. Pt discontinued on finasteride and decreased on tamsulosin to Qd (pm)  2. Pt constipation treated - if this provides no relief of urinary symptoms, he will be oriented to his options for surgical remediation.  3. F/U in 1 month for OV and cysto.  CC: Dr. Herminio Commons  Woody Seller

## 2020-12-29 NOTE — Progress Notes (Unsigned)
Urological Symptom Review  Patient is experiencing the following symptoms: Frequent urination Hard to postpone urination Get up at night to urinate Stream starts and stops Trouble starting stream Weak stream   Review of Systems  Gastrointestinal (upper)  : Indigestion/heartburn  Gastrointestinal (lower) : Negative for lower GI symptoms  Constitutional : Negative for symptoms  Skin: Negative for skin symptoms  Eyes: Negative for eye symptoms  Ear/Nose/Throat : Sinus problems  Hematologic/Lymphatic: Easy bruising  Cardiovascular : Negative for cardiovascular symptoms  Respiratory : Negative for respiratory symptoms  Endocrine: Negative for endocrine symptoms  Musculoskeletal: Back pain Joint pain  Neurological: Dizziness  Psychologic: Anxiety

## 2020-12-31 ENCOUNTER — Telehealth: Payer: Self-pay | Admitting: *Deleted

## 2020-12-31 ENCOUNTER — Encounter: Payer: Self-pay | Admitting: *Deleted

## 2020-12-31 NOTE — Telephone Encounter (Signed)
Called pt. Made aware procedures scheduled for 2/8 are being cancelled. We have rescheduled him to 3/1 pm appt. Aware will mail new prep instructions with new pre-op/covid test appt. Advised hospital will call 3 days prior with arrival time.

## 2021-01-04 ENCOUNTER — Other Ambulatory Visit (HOSPITAL_COMMUNITY): Payer: Medicare PPO

## 2021-01-04 ENCOUNTER — Encounter (HOSPITAL_COMMUNITY): Admission: RE | Admit: 2021-01-04 | Payer: Medicare PPO | Source: Ambulatory Visit

## 2021-01-07 ENCOUNTER — Ambulatory Visit: Payer: Medicare Other | Admitting: Neurology

## 2021-01-08 DIAGNOSIS — J209 Acute bronchitis, unspecified: Secondary | ICD-10-CM | POA: Diagnosis not present

## 2021-01-08 DIAGNOSIS — Z20822 Contact with and (suspected) exposure to covid-19: Secondary | ICD-10-CM | POA: Diagnosis not present

## 2021-01-12 ENCOUNTER — Encounter: Payer: Self-pay | Admitting: Urology

## 2021-01-12 ENCOUNTER — Ambulatory Visit (INDEPENDENT_AMBULATORY_CARE_PROVIDER_SITE_OTHER): Payer: Medicare PPO | Admitting: Urology

## 2021-01-12 ENCOUNTER — Other Ambulatory Visit: Payer: Self-pay

## 2021-01-12 VITALS — BP 122/71 | HR 64 | Temp 97.9°F | Ht 69.0 in | Wt 160.0 lb

## 2021-01-12 DIAGNOSIS — N138 Other obstructive and reflux uropathy: Secondary | ICD-10-CM

## 2021-01-12 DIAGNOSIS — R350 Frequency of micturition: Secondary | ICD-10-CM

## 2021-01-12 DIAGNOSIS — R351 Nocturia: Secondary | ICD-10-CM

## 2021-01-12 DIAGNOSIS — N401 Enlarged prostate with lower urinary tract symptoms: Secondary | ICD-10-CM

## 2021-01-12 LAB — URINALYSIS, ROUTINE W REFLEX MICROSCOPIC
Bilirubin, UA: NEGATIVE
Glucose, UA: NEGATIVE
Ketones, UA: NEGATIVE
Leukocytes,UA: NEGATIVE
Nitrite, UA: NEGATIVE
Protein,UA: NEGATIVE
RBC, UA: NEGATIVE
Specific Gravity, UA: 1.01 (ref 1.005–1.030)
Urobilinogen, Ur: 1 mg/dL (ref 0.2–1.0)
pH, UA: 6.5 (ref 5.0–7.5)

## 2021-01-12 NOTE — Progress Notes (Signed)
H&P  Chief Complaint: BPH w/ LUTS  History of Present Illness:   2.1.2022: Todd Hodge is a 70 year old new patient here today to be seen for his urinary frequency/BPH. He continues on dutasteride & tamsulosin (B.i.d.) with inadequate response. He reports his primary concern as urinary frequency and small volume per void. He has a weakened FOS and is experiencing intermittency. He denies any fhx prostate cancer. He does experience constipation.  2.15.2022: Todd Hodge is here today for f/u of his LUTS. He has discontinued dutasteride and continues on tamsulosin (qd). He feels his LUTS have improved between visits and he denies any urologic concerns at this time.  We called for his most recent PSA which was 1.0.  IPSS Questionnaire (AUA-7): Over the past month.   1)  How often have you had a sensation of not emptying your bladder completely after you finish urinating?  2 - Less than half the time  2)  How often have you had to urinate again less than two hours after you finished urinating? 2 - Less than half the time  3)  How often have you found you stopped and started again several times when you urinated?  2 - Less than half the time  4) How difficult have you found it to postpone urination?  1 - Less than 1 time in 5  5) How often have you had a weak urinary stream?  2 - Less than half the time  6) How often have you had to push or strain to begin urination?  1 - Less than 1 time in 5  7) How many times did you most typically get up to urinate from the time you went to bed until the time you got up in the morning?  2 - 2 times  Total score:  0-7 mildly symptomatic   8-19 moderately symptomatic   20-35 severely symptomatic  IPSS: 12 QoL Score:3   Past Medical History:  Diagnosis Date  . Anxiety disorder   . Avascular necrosis of femur head, left ([place])   . Essential hypertension, benign   . GERD (gastroesophageal reflux disease)   . History of cardiac catheterization    [place] 2000 - no  reported significant CAD  . Mitral regurgitation    Mild, echo, 02/2012  . Mixed hyperlipidemia   . Palpitations   . Seasonal allergies   . Tobacco abuse     Past Surgical History:  Procedure Laterality Date  . BACK SURGERY     May 2012  . COLONOSCOPY  09/2016   Dr. Ladona Horns in [place]: three polyps, (2 were tubular adenomas and 1 sessile serrated adenoma).   . IR KYPHO LUMBAR INC FX REDUCE BONE BX UNI/BIL CANNULATION INC/IMAGING  05/24/2018  . NOSE SURGERY     1988    Home Medications:  Allergies as of 01/12/2021      Reactions   Penicillins Other (See Comments)   Happened in [place], high temperature - Can take Amoxicillin Has patient had a PCN reaction causing immediate rash, facial/tongue/throat swelling, SOB or lightheadedness with hypotension: Unknown Has patient had a PCN reaction causing severe rash involving mucus membranes or skin necrosis: Unknown Has patient had a PCN reaction that required hospitalization: Unknown Has patient had a PCN reaction occurring within the last 10 years: No If all of the above answers are "NO", then may proceed with Cep   Doxycycline Rash      Medication List       Accurate as  of January 12, 2021  1:15 PM. If you have any questions, ask your nurse or doctor.        albuterol 108 (90 Base) MCG/ACT inhaler Commonly known as: VENTOLIN HFA Inhale 2 puffs into the lungs every 6 (six) hours as needed for wheezing or shortness of breath.   aspirin EC 81 MG tablet Take 1 tablet (81 mg total) by mouth daily.   atenolol 50 MG tablet Commonly known as: TENORMIN Take 50 mg by mouth daily.   diazepam 5 MG tablet Commonly known as: VALIUM Take 5 mg by mouth 3 (three) times daily as needed for anxiety.   diclofenac Sodium 1 % Gel Commonly known as: VOLTAREN Apply 1 application topically 4 (four) times daily as needed (sternum pain).   dutasteride 0.5 MG capsule Commonly known as: AVODART Take 0.5 mg by mouth at bedtime.   esomeprazole  40 MG capsule Commonly known as: NEXIUM Take 40 mg by mouth in the morning and at bedtime.   lisinopril 20 MG tablet Commonly known as: ZESTRIL Take 20 mg by mouth every evening.   loratadine 10 MG tablet Commonly known as: CLARITIN Take 10 mg by mouth every evening.   lovastatin 20 MG tablet Commonly known as: MEVACOR Take 20 mg by mouth at bedtime.   Oxycodone HCl 10 MG Tabs Take 10 mg by mouth 4 (four) times daily as needed for pain.   tamsulosin 0.4 MG Caps capsule Commonly known as: FLOMAX Take 0.4 mg by mouth in the morning and at bedtime.       Allergies:  Allergies  Allergen Reactions  . Penicillins Other (See Comments)    Happened in [place], high temperature - Can take Amoxicillin Has patient had a PCN reaction causing immediate rash, facial/tongue/throat swelling, SOB or lightheadedness with hypotension: Unknown Has patient had a PCN reaction causing severe rash involving mucus membranes or skin necrosis: Unknown Has patient had a PCN reaction that required hospitalization: Unknown Has patient had a PCN reaction occurring within the last 10 years: No If all of the above answers are "NO", then may proceed with Cep  . Doxycycline Rash    Family History  Problem Relation Age of Onset  . Heart failure Mother   . Colon cancer Neg Hx     Social History:  reports that he has quit smoking. His smoking use included cigarettes. He has a 25.00 pack-year smoking history. He has never used smokeless tobacco. He reports that he does not drink alcohol and does not use drugs.  ROS: A complete review of systems was performed.  All systems are negative except for pertinent findings as noted.  Physical Exam:  Vital signs in last 24 hours: There were no vitals taken for this visit. Constitutional:  Alert and oriented, No acute distress Cardiovascular: Regular rate  Neurologic: Grossly intact, no focal deficits Psychiatric: Normal mood and affect  I have reviewed  prior pt notes  I have reviewed notes from referring/previous physicians  I have reviewed urinalysis results  I have reviewed prior PSA results--1.0    Impression/Assessment:  BPH w/ LUTS - Pt tolerated his modified/reduced tamsulosin regimen well. His symptoms are well-managed and he requires no further evaluation at this time.  Plan:  1. Pt continued on tamsulosin (qd)  2. F/U in 6 months for OV and symptom recheck  CC: Dr. Jerene Bears

## 2021-01-12 NOTE — Progress Notes (Signed)
Urological Symptom Review  Patient is experiencing the following symptoms: Get up at night to urinate Stream starts and stops Erection problems (male only)   Review of Systems  Gastrointestinal (upper)  : Negative for upper GI symptoms  Gastrointestinal (lower) : Negative for lower GI symptoms  Constitutional : Negative for symptoms  Skin: Negative for skin symptoms  Eyes: Blurred vision  Ear/Nose/Throat : Sinus problems  Hematologic/Lymphatic: Easy bruising  Cardiovascular : Negative for cardiovascular symptoms  Respiratory : Shortness of breath  Endocrine: Excessive thirst  Musculoskeletal: Back pain Joint pain  Neurological:  None Psychologic: Anxiety

## 2021-01-13 ENCOUNTER — Other Ambulatory Visit: Payer: Self-pay

## 2021-01-22 DIAGNOSIS — J449 Chronic obstructive pulmonary disease, unspecified: Secondary | ICD-10-CM | POA: Diagnosis not present

## 2021-01-22 DIAGNOSIS — Z299 Encounter for prophylactic measures, unspecified: Secondary | ICD-10-CM | POA: Diagnosis not present

## 2021-01-22 DIAGNOSIS — M545 Low back pain, unspecified: Secondary | ICD-10-CM | POA: Diagnosis not present

## 2021-01-22 DIAGNOSIS — F1120 Opioid dependence, uncomplicated: Secondary | ICD-10-CM | POA: Diagnosis not present

## 2021-01-22 DIAGNOSIS — I700 Atherosclerosis of aorta: Secondary | ICD-10-CM | POA: Diagnosis not present

## 2021-01-22 NOTE — Patient Instructions (Signed)
Todd Hodge  01/22/2021     @PREFPERIOPPHARMACY @   Your procedure is scheduled on  01/26/2021.   Report to [facility] at   1145 A.M.   Call this number if you have problems the morning of surgery:  [phone]   Remember:  Follow the diet and prep instructions given to you by the office.                         Take these medicines the morning of surgery with A SIP OF WATER  Atenolol, valium (if needed), nexium, oxycodone (If needed), flomax.  Use your inhaler before you come and bring your rescue inhaler with you.    Please brush your teeth.  Do not wear jewelry, make-up or nail polish.  Do not wear lotions, powders, or perfumes, or deodorant.  Do not shave 48 hours prior to surgery.  Men may shave face and neck.  Do not bring valuables to the hospital.  [facility] is not responsible for any belongings or valuables.   Contacts, dentures or bridgework may not be worn into surgery.  Leave your suitcase in the car.  After surgery it may be brought to your room.  For patients admitted to the hospital, discharge time will be determined by your treatment team.  Patients discharged the day of surgery will not be allowed to drive home and must  have someone with you for 24 hours.   Special instructions:  DO NOT smoke tobacco or vape the morning of your procedure.   Please read over the following fact sheets that you were given. Anesthesia Post-op Instructions and Care and Recovery After Surgery       Upper Endoscopy, Adult, Care After This sheet gives you information about how to care for yourself after your procedure. Your health care provider may also give you more specific instructions. If you have problems or questions, contact your health care provider. What can I expect after the procedure? After the procedure, it is common to have:  A sore throat.  Mild stomach pain or discomfort.  Bloating.  Nausea. Follow these instructions at  home:  Follow instructions from your health care provider about what to eat or drink after your procedure.  Return to your normal activities as told by your health care provider. Ask your health care provider what activities are safe for you.  Take over-the-counter and prescription medicines only as told by your health care provider.  If you were given a sedative during the procedure, it can affect you for several hours. Do not drive or operate machinery until your health care provider says that it is safe.  Keep all follow-up visits as told by your health care provider. This is important.   Contact a health care provider if you have:  A sore throat that lasts longer than one day.  Trouble swallowing. Get help right away if:  You vomit blood or your vomit looks like coffee grounds.  You have: ? A fever. ? Bloody, black, or tarry stools. ? A severe sore throat or you cannot swallow. ? Difficulty breathing. ? Severe pain in your chest or abdomen. Summary  After the procedure, it is common to have a sore throat, mild stomach discomfort, bloating, and nausea.  If you were given a sedative during the procedure, it can affect you for several hours. Do not drive or operate machinery until your health care provider  says that it is safe.  Follow instructions from your health care provider about what to eat or drink after your procedure.  Return to your normal activities as told by your health care provider. This information is not intended to replace advice given to you by your health care provider. Make sure you discuss any questions you have with your health care provider. Document Revised: 11/12/2019 Document Reviewed: 04/16/2018 Elsevier Patient Education  2021 [place].  Colonoscopy, Adult, Care After This sheet gives you information about how to care for yourself after your procedure. Your health care provider may also give you more specific instructions. If you have  problems or questions, contact your health care provider. What can I expect after the procedure? After the procedure, it is common to have:  A small amount of blood in your stool for 24 hours after the procedure.  Some gas.  Mild cramping or bloating of your abdomen. Follow these instructions at home: Eating and drinking  Drink enough fluid to keep your urine pale yellow.  Follow instructions from your health care provider about eating or drinking restrictions.  Resume your normal diet as instructed by your health care provider. Avoid heavy or fried foods that are hard to digest.   Activity  Rest as told by your health care provider.  Avoid sitting for a long time without moving. Get up to take short walks every 1-2 hours. This is important to improve blood flow and breathing. Ask for help if you feel weak or unsteady.  Return to your normal activities as told by your health care provider. Ask your health care provider what activities are safe for you. Managing cramping and bloating  Try walking around when you have cramps or feel bloated.  Apply heat to your abdomen as told by your health care provider. Use the heat source that your health care provider recommends, such as a moist heat pack or a heating pad. ? Place a towel between your skin and the heat source. ? Leave the heat on for 20-30 minutes. ? Remove the heat if your skin turns bright red. This is especially important if you are unable to feel pain, heat, or cold. You may have a greater risk of getting burned.   General instructions  If you were given a sedative during the procedure, it can affect you for several hours. Do not drive or operate machinery until your health care provider says that it is safe.  For the first 24 hours after the procedure: ? Do not sign important documents. ? Do not drink alcohol. ? Do your regular daily activities at a slower pace than normal. ? Eat soft foods that are easy to  digest.  Take over-the-counter and prescription medicines only as told by your health care provider.  Keep all follow-up visits as told by your health care provider. This is important. Contact a health care provider if:  You have blood in your stool 2-3 days after the procedure. Get help right away if you have:  More than a small spotting of blood in your stool.  Large blood clots in your stool.  Swelling of your abdomen.  Nausea or vomiting.  A fever.  Increasing pain in your abdomen that is not relieved with medicine. Summary  After the procedure, it is common to have a small amount of blood in your stool. You may also have mild cramping and bloating of your abdomen.  If you were given a sedative during the procedure, it  can affect you for several hours. Do not drive or operate machinery until your health care provider says that it is safe.  Get help right away if you have a lot of blood in your stool, nausea or vomiting, a fever, or increased pain in your abdomen. This information is not intended to replace advice given to you by your health care provider. Make sure you discuss any questions you have with your health care provider. Document Revised: 11/08/2019 Document Reviewed: 06/10/2019 Elsevier Patient Education  2021 [place] After This sheet gives you information about how to care for yourself after your procedure. Your health care provider may also give you more specific instructions. If you have problems or questions, contact your health care provider. What can I expect after the procedure? After the procedure, it is common to have:  Tiredness.  Forgetfulness about what happened after the procedure.  Impaired judgment for important decisions.  Nausea or vomiting.  Some difficulty with balance. Follow these instructions at home: For the time period you were told by your health care provider:  Rest as needed.  Do not  participate in activities where you could fall or become injured.  Do not drive or use machinery.  Do not drink alcohol.  Do not take sleeping pills or medicines that cause drowsiness.  Do not make important decisions or sign legal documents.  Do not take care of children on your own.      Eating and drinking  Follow the diet that is recommended by your health care provider.  Drink enough fluid to keep your urine pale yellow.  If you vomit: ? Drink water, juice, or soup when you can drink without vomiting. ? Make sure you have little or no nausea before eating solid foods. General instructions  Have a responsible adult stay with you for the time you are told. It is important to have someone help care for you until you are awake and alert.  Take over-the-counter and prescription medicines only as told by your health care provider.  If you have sleep apnea, surgery and certain medicines can increase your risk for breathing problems. Follow instructions from your health care provider about wearing your sleep device: ? Anytime you are sleeping, including during daytime naps. ? While taking prescription pain medicines, sleeping medicines, or medicines that make you drowsy.  Avoid smoking.  Keep all follow-up visits as told by your health care provider. This is important. Contact a health care provider if:  You keep feeling nauseous or you keep vomiting.  You feel light-headed.  You are still sleepy or having trouble with balance after 24 hours.  You develop a rash.  You have a fever.  You have redness or swelling around the IV site. Get help right away if:  You have trouble breathing.  You have new-onset confusion at home. Summary  For several hours after your procedure, you may feel tired. You may also be forgetful and have poor judgment.  Have a responsible adult stay with you for the time you are told. It is important to have someone help care for you until you  are awake and alert.  Rest as told. Do not drive or operate machinery. Do not drink alcohol or take sleeping pills.  Get help right away if you have trouble breathing, or if you suddenly become confused. This information is not intended to replace advice given to you by your health care provider. Make sure you discuss any  questions you have with your health care provider. Document Revised: 07/30/2020 Document Reviewed: 10/17/2019 Elsevier Patient Education  2021 [employer].

## 2021-01-25 ENCOUNTER — Other Ambulatory Visit (HOSPITAL_COMMUNITY): Payer: Medicare PPO

## 2021-01-25 ENCOUNTER — Encounter (HOSPITAL_COMMUNITY)
Admission: RE | Admit: 2021-01-25 | Discharge: 2021-01-25 | Disposition: A | Discharge status: Home / Self Care | Payer: Medicare PPO | Source: Ambulatory Visit | Attending: Internal Medicine | Admitting: Internal Medicine

## 2021-01-25 ENCOUNTER — Telehealth: Payer: Self-pay | Admitting: Internal Medicine

## 2021-01-25 NOTE — Telephone Encounter (Signed)
Patient needs to reschedule his procedure

## 2021-01-25 NOTE — Telephone Encounter (Signed)
Spoke to pt's wife, TCS/EGD rescheduled for 02/23/21--PM. Endo scheduler informed.

## 2021-01-25 NOTE — Telephone Encounter (Signed)
[employer], spoke to [place]. PA for TCS/EGD updated. [employer]# [id number], valid 02/23/21-03/25/21.

## 2021-01-25 NOTE — Telephone Encounter (Signed)
Pre-op/covid test 02/19/21. Appt letter mailed with new procedure instructions.

## 2021-01-26 ENCOUNTER — Other Ambulatory Visit: Payer: Self-pay

## 2021-02-16 ENCOUNTER — Other Ambulatory Visit: Payer: Medicare PPO | Admitting: Urology

## 2021-02-17 NOTE — Patient Instructions (Signed)
Todd Hodge  02/17/2021     @PREFPERIOPPHARMACY @   Your procedure is scheduled on  02/23/2021   Report to [facility] at  1200  P.M.    Call this number if you have problems the morning of surgery:  [phone]   Remember:  Follow the diet and prep instructions given to you by the office.                     Take these medicines the morning of surgery with A SIP OF WATER  Atenolol, valium(if needed), nexium, oxycodone (if needed), flomax.    Please brush your teeth.  Do not wear jewelry, make-up or nail polish.  Do not wear lotions, powders, or perfumes, or deodorant.  Do not shave 48 hours prior to surgery.  Men may shave face and neck.  Do not bring valuables to the hospital.  [facility] is not responsible for any belongings or valuables.  Contacts, dentures or bridgework may not be worn into surgery.  Leave your suitcase in the car.  After surgery it may be brought to your room.  For patients admitted to the hospital, discharge time will be determined by your treatment team.  Patients discharged the day of surgery will not be allowed to drive home and must have someone with them for 24 hours.    Special instructions:  DO NOT smoke tobacco or vape the morning of your procedure.   Please read over the following fact sheets that you were given. Anesthesia Post-op Instructions and Care and Recovery After Surgery       Colonoscopy, Adult, Care After This sheet gives you information about how to care for yourself after your procedure. Your health care provider may also give you more specific instructions. If you have problems or questions, contact your health care provider. What can I expect after the procedure? After the procedure, it is common to have:  A small amount of blood in your stool for 24 hours after the procedure.  Some gas.  Mild cramping or bloating of your abdomen. Follow these instructions at home: Eating and drinking  Drink enough  fluid to keep your urine pale yellow.  Follow instructions from your health care provider about eating or drinking restrictions.  Resume your normal diet as instructed by your health care provider. Avoid heavy or fried foods that are hard to digest.   Activity  Rest as told by your health care provider.  Avoid sitting for a long time without moving. Get up to take short walks every 1-2 hours. This is important to improve blood flow and breathing. Ask for help if you feel weak or unsteady.  Return to your normal activities as told by your health care provider. Ask your health care provider what activities are safe for you. Managing cramping and bloating  Try walking around when you have cramps or feel bloated.  Apply heat to your abdomen as told by your health care provider. Use the heat source that your health care provider recommends, such as a moist heat pack or a heating pad. ? Place a towel between your skin and the heat source. ? Leave the heat on for 20-30 minutes. ? Remove the heat if your skin turns bright red. This is especially important if you are unable to feel pain, heat, or cold. You may have a greater risk of getting burned.   General instructions  If you were given a sedative  during the procedure, it can affect you for several hours. Do not drive or operate machinery until your health care provider says that it is safe.  For the first 24 hours after the procedure: ? Do not sign important documents. ? Do not drink alcohol. ? Do your regular daily activities at a slower pace than normal. ? Eat soft foods that are easy to digest.  Take over-the-counter and prescription medicines only as told by your health care provider.  Keep all follow-up visits as told by your health care provider. This is important. Contact a health care provider if:  You have blood in your stool 2-3 days after the procedure. Get help right away if you have:  More than a small spotting of blood  in your stool.  Large blood clots in your stool.  Swelling of your abdomen.  Nausea or vomiting.  A fever.  Increasing pain in your abdomen that is not relieved with medicine. Summary  After the procedure, it is common to have a small amount of blood in your stool. You may also have mild cramping and bloating of your abdomen.  If you were given a sedative during the procedure, it can affect you for several hours. Do not drive or operate machinery until your health care provider says that it is safe.  Get help right away if you have a lot of blood in your stool, nausea or vomiting, a fever, or increased pain in your abdomen. This information is not intended to replace advice given to you by your health care provider. Make sure you discuss any questions you have with your health care provider. Document Revised: 11/08/2019 Document Reviewed: 06/10/2019 Elsevier Patient Education  2021 [place] After This sheet gives you information about how to care for yourself after your procedure. Your health care provider may also give you more specific instructions. If you have problems or questions, contact your health care provider. What can I expect after the procedure? After the procedure, it is common to have:  Tiredness.  Forgetfulness about what happened after the procedure.  Impaired judgment for important decisions.  Nausea or vomiting.  Some difficulty with balance. Follow these instructions at home: For the time period you were told by your health care provider:  Rest as needed.  Do not participate in activities where you could fall or become injured.  Do not drive or use machinery.  Do not drink alcohol.  Do not take sleeping pills or medicines that cause drowsiness.  Do not make important decisions or sign legal documents.  Do not take care of children on your own.      Eating and drinking  Follow the diet that is recommended  by your health care provider.  Drink enough fluid to keep your urine pale yellow.  If you vomit: ? Drink water, juice, or soup when you can drink without vomiting. ? Make sure you have little or no nausea before eating solid foods. General instructions  Have a responsible adult stay with you for the time you are told. It is important to have someone help care for you until you are awake and alert.  Take over-the-counter and prescription medicines only as told by your health care provider.  If you have sleep apnea, surgery and certain medicines can increase your risk for breathing problems. Follow instructions from your health care provider about wearing your sleep device: ? Anytime you are sleeping, including during daytime naps. ? While taking prescription  pain medicines, sleeping medicines, or medicines that make you drowsy.  Avoid smoking.  Keep all follow-up visits as told by your health care provider. This is important. Contact a health care provider if:  You keep feeling nauseous or you keep vomiting.  You feel light-headed.  You are still sleepy or having trouble with balance after 24 hours.  You develop a rash.  You have a fever.  You have redness or swelling around the IV site. Get help right away if:  You have trouble breathing.  You have new-onset confusion at home. Summary  For several hours after your procedure, you may feel tired. You may also be forgetful and have poor judgment.  Have a responsible adult stay with you for the time you are told. It is important to have someone help care for you until you are awake and alert.  Rest as told. Do not drive or operate machinery. Do not drink alcohol or take sleeping pills.  Get help right away if you have trouble breathing, or if you suddenly become confused. This information is not intended to replace advice given to you by your health care provider. Make sure you discuss any questions you have with your  health care provider. Document Revised: 07/30/2020 Document Reviewed: 10/17/2019 Elsevier Patient Education  2021 [employer].

## 2021-02-18 DIAGNOSIS — N183 Chronic kidney disease, stage 3 unspecified: Secondary | ICD-10-CM | POA: Diagnosis not present

## 2021-02-18 DIAGNOSIS — Z299 Encounter for prophylactic measures, unspecified: Secondary | ICD-10-CM | POA: Diagnosis not present

## 2021-02-18 DIAGNOSIS — M545 Low back pain, unspecified: Secondary | ICD-10-CM | POA: Diagnosis not present

## 2021-02-18 DIAGNOSIS — J449 Chronic obstructive pulmonary disease, unspecified: Secondary | ICD-10-CM | POA: Diagnosis not present

## 2021-02-18 DIAGNOSIS — I700 Atherosclerosis of aorta: Secondary | ICD-10-CM | POA: Diagnosis not present

## 2021-02-19 ENCOUNTER — Other Ambulatory Visit: Payer: Self-pay

## 2021-02-19 ENCOUNTER — Other Ambulatory Visit (HOSPITAL_COMMUNITY)
Admission: RE | Admit: 2021-02-19 | Discharge: 2021-02-19 | Disposition: A | Discharge status: Home / Self Care | Payer: Medicare PPO | Source: Ambulatory Visit | Attending: Internal Medicine | Admitting: Internal Medicine

## 2021-02-19 ENCOUNTER — Encounter (HOSPITAL_COMMUNITY)
Admission: RE | Admit: 2021-02-19 | Discharge: 2021-02-19 | Disposition: A | Discharge status: Home / Self Care | Payer: Medicare PPO | Source: Ambulatory Visit | Attending: Internal Medicine | Admitting: Internal Medicine

## 2021-02-19 ENCOUNTER — Encounter (HOSPITAL_COMMUNITY): Payer: Self-pay

## 2021-02-19 DIAGNOSIS — Z20822 Contact with and (suspected) exposure to covid-19: Secondary | ICD-10-CM | POA: Insufficient documentation

## 2021-02-19 DIAGNOSIS — Z01818 Encounter for other preprocedural examination: Secondary | ICD-10-CM | POA: Insufficient documentation

## 2021-02-19 LAB — SARS CORONAVIRUS 2 (TAT 6-24 HRS): SARS Coronavirus 2: NEGATIVE

## 2021-02-23 ENCOUNTER — Ambulatory Visit (HOSPITAL_COMMUNITY)
Admission: RE | Admit: 2021-02-23 | Discharge: 2021-02-23 | Disposition: A | Discharge status: Home / Self Care | Payer: Medicare PPO | Attending: Internal Medicine | Admitting: Internal Medicine

## 2021-02-23 ENCOUNTER — Encounter (HOSPITAL_COMMUNITY)
Admission: RE | Disposition: A | Discharge status: Home / Self Care | Payer: Self-pay | Source: Home / Self Care | Attending: Internal Medicine

## 2021-02-23 ENCOUNTER — Encounter (HOSPITAL_COMMUNITY): Payer: Self-pay

## 2021-02-23 ENCOUNTER — Ambulatory Visit (HOSPITAL_COMMUNITY): Payer: Medicare PPO | Admitting: Anesthesiology

## 2021-02-23 ENCOUNTER — Other Ambulatory Visit: Payer: Self-pay

## 2021-02-23 DIAGNOSIS — K2289 Other specified disease of esophagus: Secondary | ICD-10-CM | POA: Insufficient documentation

## 2021-02-23 DIAGNOSIS — K2970 Gastritis, unspecified, without bleeding: Secondary | ICD-10-CM | POA: Insufficient documentation

## 2021-02-23 DIAGNOSIS — J449 Chronic obstructive pulmonary disease, unspecified: Secondary | ICD-10-CM | POA: Diagnosis not present

## 2021-02-23 DIAGNOSIS — Z86010 Personal history of colonic polyps: Secondary | ICD-10-CM | POA: Diagnosis not present

## 2021-02-23 DIAGNOSIS — Z87891 Personal history of nicotine dependence: Secondary | ICD-10-CM | POA: Diagnosis not present

## 2021-02-23 DIAGNOSIS — Z881 Allergy status to other antibiotic agents status: Secondary | ICD-10-CM | POA: Insufficient documentation

## 2021-02-23 DIAGNOSIS — Z1211 Encounter for screening for malignant neoplasm of colon: Secondary | ICD-10-CM | POA: Diagnosis not present

## 2021-02-23 DIAGNOSIS — Z79899 Other long term (current) drug therapy: Secondary | ICD-10-CM | POA: Diagnosis not present

## 2021-02-23 DIAGNOSIS — Z7982 Long term (current) use of aspirin: Secondary | ICD-10-CM | POA: Diagnosis not present

## 2021-02-23 DIAGNOSIS — K2270 Barrett's esophagus without dysplasia: Secondary | ICD-10-CM | POA: Insufficient documentation

## 2021-02-23 DIAGNOSIS — R933 Abnormal findings on diagnostic imaging of other parts of digestive tract: Secondary | ICD-10-CM | POA: Diagnosis present

## 2021-02-23 DIAGNOSIS — K648 Other hemorrhoids: Secondary | ICD-10-CM | POA: Insufficient documentation

## 2021-02-23 DIAGNOSIS — Z880 Allergy status to penicillin: Secondary | ICD-10-CM | POA: Insufficient documentation

## 2021-02-23 DIAGNOSIS — K3189 Other diseases of stomach and duodenum: Secondary | ICD-10-CM | POA: Diagnosis not present

## 2021-02-23 HISTORY — PX: ESOPHAGOGASTRODUODENOSCOPY (EGD) WITH PROPOFOL: SHX5813

## 2021-02-23 HISTORY — PX: BIOPSY: SHX5522

## 2021-02-23 HISTORY — PX: COLONOSCOPY WITH PROPOFOL: SHX5780

## 2021-02-23 SURGERY — COLONOSCOPY WITH PROPOFOL
Anesthesia: General

## 2021-02-23 MED ORDER — LIDOCAINE HCL (CARDIAC) PF 100 MG/5ML IV SOSY
PREFILLED_SYRINGE | INTRAVENOUS | Status: DC | PRN
  Administered 2021-02-23: 50 mg via INTRAVENOUS

## 2021-02-23 MED ORDER — LACTATED RINGERS IV SOLN: INTRAVENOUS | Status: DC

## 2021-02-23 MED ORDER — PROPOFOL 10 MG/ML IV BOLUS
INTRAVENOUS | Status: DC | PRN
Start: 2021-02-23 — End: 2021-02-23
  Administered 2021-02-23: 80 mg via INTRAVENOUS

## 2021-02-23 MED ORDER — PROPOFOL 500 MG/50ML IV EMUL
INTRAVENOUS | Status: DC | PRN
  Administered 2021-02-23: 150 ug/kg/min via INTRAVENOUS

## 2021-02-23 NOTE — Anesthesia Preprocedure Evaluation (Signed)
Anesthesia Evaluation  Patient identified by MRN, date of birth, ID band Patient awake    Reviewed: Allergy & Precautions, NPO status , Patient's Chart, lab work & pertinent test results, reviewed documented beta blocker date and time   History of Anesthesia Complications Negative for: history of anesthetic complications  Airway Mallampati: I  TM Distance: >3 FB Neck ROM: Full    Dental  (+) Dental Advisory Given, Edentulous Upper, Poor Dentition, Chipped, Missing   Pulmonary COPD,  COPD inhaler, former smoker,    Pulmonary exam normal breath sounds clear to auscultation       Cardiovascular Exercise Tolerance: Good hypertension, Pt. on medications and Pt. on home beta blockers Normal cardiovascular exam+ dysrhythmias (palpitations ) + Valvular Problems/Murmurs MR  Rhythm:Regular Rate:Normal  19-Feb-2021 09:03:31 [facility] ROUTINE RECORD Sinus bradycardia Otherwise normal ECG No previous tracing Confirmed by Quay Burow [phone]) on 02/20/2021 8:22:53 AM   Neuro/Psych PSYCHIATRIC DISORDERS Anxiety negative neurological ROS     GI/Hepatic Neg liver ROS, GERD  Medicated,  Endo/Other  negative endocrine ROS  Renal/GU negative Renal ROS     Musculoskeletal  (+) Arthritis  (back sx), narcotic dependent  Abdominal   Peds  Hematology negative hematology ROS (+)   Anesthesia Other Findings   Reproductive/Obstetrics negative OB ROS                             Anesthesia Physical Anesthesia Plan  ASA: II  Anesthesia Plan: General   Post-op Pain Management:    Induction: Intravenous  PONV Risk Score and Plan: Propofol infusion  Airway Management Planned: Nasal Cannula and Natural Airway  Additional Equipment:   Intra-op Plan:   Post-operative Plan:   Informed Consent: I have reviewed the patients History and Physical, chart, labs and discussed the procedure  including the risks, benefits and alternatives for the proposed anesthesia with the patient or authorized representative who has indicated his/her understanding and acceptance.     Dental advisory given  Plan Discussed with: CRNA and Surgeon  Anesthesia Plan Comments:         Anesthesia Quick Evaluation

## 2021-02-23 NOTE — Op Note (Signed)
[facility] Patient Name: Todd Hodge Procedure Date: 02/23/2021 12:55 PM MRN: [id number] Date of Birth: 19-Apr-1951 Attending MD: Elon Alas. Edgar Frisk CSN: [id number] Age: 70 Admit Type: Outpatient Procedure:                Colonoscopy Indications:              High risk colon cancer surveillance: Personal                            history of colonic polyps Providers:                Elon Alas. Abbey Chatters, DO, Janeece Riggers, RN, Aram Candela Referring MD:              Medicines:                See the Anesthesia note for documentation of the                            administered medications Complications:            No immediate complications. Estimated Blood Loss:     Estimated blood loss: none. Procedure:                Pre-Anesthesia Assessment:                           - The anesthesia plan was to use monitored                            anesthesia care (MAC).                           After obtaining informed consent, the colonoscope                            was passed under direct vision. Throughout the                            procedure, the patient's blood pressure, pulse, and                            oxygen saturations were monitored continuously. The                            [id number]-[id number] ([id number]) scope was introduced through                            the anus and advanced to the the cecum, identified                            by appendiceal orifice and ileocecal valve. The                            colonoscopy was performed without difficulty. The                            patient tolerated the  procedure well. The quality                            of the bowel preparation was evaluated using the                            BBPS [facility] Bowel Preparation Scale) with scores                            of: Right Colon = 2 (minor amount of residual                            staining, small fragments of stool and/or opaque                            liquid, but  mucosa seen well), Transverse Colon = 3                            (entire mucosa seen well with no residual staining,                            small fragments of stool or opaque liquid) and Left                            Colon = 3 (entire mucosa seen well with no residual                            staining, small fragments of stool or opaque                            liquid). The total BBPS score equals 8. The quality                            of the bowel preparation was good. Scope In: 12:57:52 PM Scope Out: 1:07:42 PM Scope Withdrawal Time: 0 hours 7 minutes 9 seconds  Total Procedure Duration: 0 hours 9 minutes 50 seconds  Findings:      The perianal and digital rectal examinations were normal.      Non-bleeding internal hemorrhoids were found during endoscopy.      The exam was otherwise without abnormality. Impression:               - Non-bleeding internal hemorrhoids.                           - The examination was otherwise normal.                           - No specimens collected. Moderate Sedation:      Per Anesthesia Care Recommendation:           - Patient has a contact number available for                            emergencies. The signs and symptoms of potential  delayed complications were discussed with the                            patient. Return to normal activities tomorrow.                            Written discharge instructions were provided to the                            patient.                           - Resume previous diet.                           - Continue present medications.                           - No repeat colonoscopy due to age.                           - Return to GI clinic in 3 months. Procedure Code(s):        --- Professional ---                           [id number], Colonoscopy, flexible; diagnostic, including                            collection of specimen(s) by brushing or washing,                             when performed (separate procedure) Diagnosis Code(s):        --- Professional ---                           Z86.010, Personal history of colonic polyps                           K64.8, Other hemorrhoids CPT copyright 2019 American Medical Association. All rights reserved. The codes documented in this report are preliminary and upon coder review may  be revised to meet current compliance requirements. Elon Alas. Abbey Chatters, DO [place] Abbey Chatters, DO 02/23/2021 1:09:52 PM This report has been signed electronically. Number of Addenda: 0

## 2021-02-23 NOTE — Op Note (Signed)
[facility] Patient Name: Todd Hodge Procedure Date: 02/23/2021 12:25 PM MRN: [id number] Date of Birth: 01/20/1951 Attending MD: Elon Alas. Abbey Chatters DO CSN: [id number] Age: 70 Admit Type: Outpatient Procedure:                Upper GI endoscopy Indications:              Abnormal CT of the GI tract Providers:                Elon Alas. Abbey Chatters, DO, Janeece Riggers, RN, Aram Candela Referring MD:              Medicines:                See the Anesthesia note for documentation of the                            administered medications Complications:            No immediate complications. Estimated Blood Loss:     Estimated blood loss was minimal. Procedure:                Pre-Anesthesia Assessment:                           - The anesthesia plan was to use monitored                            anesthesia care (MAC).                           After obtaining informed consent, the endoscope was                            passed under direct vision. Throughout the                            procedure, the patient's blood pressure, pulse, and                            oxygen saturations were monitored continuously. The                            [id number]) was introduced through the mouth,                            and advanced to the second part of duodenum. The                            upper GI endoscopy was accomplished without                            difficulty. The patient tolerated the procedure                            well. Scope In: 12:50:18 PM Scope Out: 12:54:29 PM Total Procedure Duration: 0 hours 4 minutes 11 seconds  Findings:      The Z-line was irregular and was found  40 cm from the incisors. Biopsies       were taken with a cold forceps for histology.      Diffuse mild inflammation characterized by erythema was found in the       entire examined stomach. Biopsies were taken with a cold forceps for       Helicobacter pylori testing.      The duodenal bulb,  first portion of the duodenum and second portion of       the duodenum were normal. Impression:               - Z-line irregular, 40 cm from the incisors.                            Biopsied.                           - Gastritis. Biopsied.                           - Normal duodenal bulb, first portion of the                            duodenum and second portion of the duodenum. Moderate Sedation:      Per Anesthesia Care Recommendation:           - Patient has a contact number available for                            emergencies. The signs and symptoms of potential                            delayed complications were discussed with the                            patient. Return to normal activities tomorrow.                            Written discharge instructions were provided to the                            patient.                           - Resume previous diet.                           - Continue present medications.                           - Await pathology results.                           - Return to GI clinic in 3 months. Procedure Code(s):        --- Professional ---                           [phone], Esophagogastroduodenoscopy, flexible,  transoral; with biopsy, single or multiple Diagnosis Code(s):        --- Professional ---                           K22.8, Other specified diseases of esophagus                           K29.70, Gastritis, unspecified, without bleeding                           R93.3, Abnormal findings on diagnostic imaging of                            other parts of digestive tract CPT copyright 2019 American Medical Association. All rights reserved. The codes documented in this report are preliminary and upon coder review may  be revised to meet current compliance requirements. Elon Alas. Abbey Chatters, DO [place] Abbey Chatters, DO 02/23/2021 12:55:57 PM This report has been signed electronically. Number of Addenda: 0

## 2021-02-23 NOTE — H&P (Signed)
Primary Care Physician:  Glenda Chroman, MD Primary Gastroenterologist:  Dr. Abbey Chatters  Pre-Procedure History & Physical: HPI:  Todd Hodge is a 70 y.o. male is here here for and EGD for abnormal CT showing gastric wall thickening and colonoscopy for surveillance purposes due to personal history of adenomatous colon polyps. Patient notes sometimes he goes 3-4 days without BM. Has to strain to have a BM. No rectal bleeding. Notes suprapubic and abdominal pain at level of umbilicus for past few months. Pain eased off after BM. No weight loss. Stays around 160 lbs. No dysphagia.   Past Medical History:  Diagnosis Date  . Anxiety disorder   . Avascular necrosis of femur head, left ([place])   . Essential hypertension, benign   . GERD (gastroesophageal reflux disease)   . History of cardiac catheterization    [place] 2000 - no reported significant CAD  . Mitral regurgitation    Mild, echo, 02/2012  . Mixed hyperlipidemia   . Palpitations   . Seasonal allergies   . Tobacco abuse     Past Surgical History:  Procedure Laterality Date  . BACK SURGERY     May 2012  . COLONOSCOPY  09/2016   Dr. Ladona Horns in [place]: three polyps, (2 were tubular adenomas and 1 sessile serrated adenoma).   . IR KYPHO LUMBAR INC FX REDUCE BONE BX UNI/BIL CANNULATION INC/IMAGING  05/24/2018  . NOSE SURGERY     1988    Prior to Admission medications   Medication Sig Start Date End Date Taking? Authorizing Provider  albuterol (VENTOLIN HFA) 108 (90 Base) MCG/ACT inhaler Inhale 2 puffs into the lungs every 6 (six) hours as needed for wheezing or shortness of breath.    Yes [provider]  aspirin EC 81 MG tablet Take 1 tablet (81 mg total) by mouth daily. 06/28/13  Yes Serpe, Burna Forts, PA-C  atenolol (TENORMIN) 50 MG tablet Take 50 mg by mouth daily.  02/28/12  Yes [provider]  diazepam (VALIUM) 5 MG tablet Take 5 mg by mouth 3 (three) times daily as needed for anxiety.    Yes [provider]   diclofenac Sodium (VOLTAREN) 1 % GEL Apply 1 application topically 4 (four) times daily as needed (sternum pain). 09/23/20  Yes [provider]  dutasteride (AVODART) 0.5 MG capsule Take 0.5 mg by mouth at bedtime.   Yes [provider]  esomeprazole (NEXIUM) 40 MG capsule Take 40 mg by mouth in the morning and at bedtime.   Yes [provider]  lisinopril (ZESTRIL) 20 MG tablet Take 20 mg by mouth every evening. 12/02/20  Yes [provider]  loratadine (CLARITIN) 10 MG tablet Take 10 mg by mouth every evening.   Yes [provider]  lovastatin (MEVACOR) 20 MG tablet Take 20 mg by mouth at bedtime.   Yes [provider]  Oxycodone HCl 10 MG TABS Take 10 mg by mouth 4 (four) times daily as needed for pain. 11/26/20  Yes [provider]  tamsulosin (FLOMAX) 0.4 MG CAPS capsule Take 0.4 mg by mouth in the morning and at bedtime.   Yes [provider]    Allergies as of 12/03/2020 - Review Complete 12/03/2020  Allergen Reaction Noted  . Penicillins Other (See Comments) 03/07/2012  . Doxycycline Rash 03/07/2012    Family History  Problem Relation Age of Onset  . Heart failure Mother   . Colon cancer Neg Hx     Social History  Socioeconomic History  . Marital status: Married    Spouse name: Not on file  . Number of children: Not on file  . Years of education: Not on file  . Highest education level: Not on file  Occupational History  . Occupation: Disabled    Comment: Sales  Tobacco Use  . Smoking status: Former Smoker    Packs/day: 1.00    Years: 25.00    Pack years: 25.00    Types: Cigarettes  . Smokeless tobacco: Never Used  . Tobacco comment: can't remember how long ago he smoked  Vaping Use  . Vaping Use: Never used  Substance and Sexual Activity  . Alcohol use: No    Comment: history of ETOH use in past  . Drug use: No  . Sexual activity: Not on file  Other Topics Concern  . Not on file  Social  History Narrative  . Not on file   Social Determinants of Health   Financial Resource Strain: Not on file  Food Insecurity: Not on file  Transportation Needs: Not on file  Physical Activity: Not on file  Stress: Not on file  Social Connections: Not on file  Intimate Partner Violence: Not on file    Review of Systems: See HPI, otherwise negative ROS  Physical Exam: Vital signs in last 24 hours: Temp:  [98.2 F (36.8 C)] 98.2 F (36.8 C) (03/29 1210) Pulse Rate:  [61] 61 (03/29 1210) Resp:  [13] 13 (03/29 1210) BP: (111)/(60) 111/60 (03/29 1210) SpO2:  [98 %] 98 % (03/29 1210)   General:   Alert,  Well-developed, well-nourished, pleasant and cooperative in NAD Head:  Normocephalic and atraumatic. Eyes:  Sclera clear, no icterus.   Conjunctiva pink. Ears:  Normal auditory acuity. Nose:  No deformity, discharge,  or lesions. Mouth:  No deformity or lesions, dentition normal. Neck:  Supple; no masses or thyromegaly. Lungs:  Clear throughout to auscultation.   No wheezes, crackles, or rhonchi. No acute distress. Heart:  Regular rate and rhythm; no murmurs, clicks, rubs,  or gallops. Abdomen:  Soft, nontender and nondistended. No masses, hepatosplenomegaly or hernias noted. Normal bowel sounds, without guarding, and without rebound.   Msk:  Symmetrical without gross deformities. Normal posture. Extremities:  Without clubbing or edema. Neurologic:  Alert and  oriented x4;  grossly normal neurologically. Skin:  Intact without significant lesions or rashes. Cervical Nodes:  No significant cervical adenopathy. Psych:  Alert and cooperative. Normal mood and affect.  Impression/Plan: TAVORIS BRISK is here for and EGD for abnormal CT showing gastric wall thickening and colonoscopy for surveillance purposes due to personal history of adenomatous colon polyps.   The risks of the procedure including infection, bleed, or perforation as well as benefits, limitations, alternatives and  imponderables have been reviewed with the patient. Questions have been answered. All parties agreeable.

## 2021-02-23 NOTE — Transfer of Care (Signed)
Immediate Anesthesia Transfer of Care Note  Patient: Todd Hodge  Procedure(s) Performed: COLONOSCOPY WITH PROPOFOL (N/A ) ESOPHAGOGASTRODUODENOSCOPY (EGD) WITH PROPOFOL (N/A ) BIOPSY  Patient Location: PACU  Anesthesia Type:General  Level of Consciousness: awake  Airway & Oxygen Therapy: Patient Spontanous Breathing  Post-op Assessment: Report given to RN and Post -op Vital signs reviewed and stable  Post vital signs: Reviewed and stable  Last Vitals:  Vitals Value Taken Time  BP 92/56 02/23/21 1318  Temp    Pulse 55 02/23/21 1315  Resp 18 02/23/21 1315  SpO2 99 % 02/23/21 1318    Last Pain:  Vitals:   02/23/21 1315  TempSrc:   PainSc: 0-No pain      Patients Stated Pain Goal: 6 (93/55/21 7471)  Complications: No complications documented.

## 2021-02-23 NOTE — Discharge Instructions (Signed)
EGD Discharge instructions Please read the instructions outlined below and refer to this sheet in the next few weeks. These discharge instructions provide you with general information on caring for yourself after you leave the hospital. Your doctor may also give you specific instructions. While your treatment has been planned according to the most current medical practices available, unavoidable complications occasionally occur. If you have any problems or questions after discharge, please call your doctor. ACTIVITY  You may resume your regular activity but move at a slower pace for the next 24 hours.   Take frequent rest periods for the next 24 hours.   Walking will help expel (get rid of) the air and reduce the bloated feeling in your abdomen.   No driving for 24 hours (because of the anesthesia (medicine) used during the test).   You may shower.   Do not sign any important legal documents or operate any machinery for 24 hours (because of the anesthesia used during the test).  NUTRITION  Drink plenty of fluids.   You may resume your normal diet.   Begin with a light meal and progress to your normal diet.   Avoid alcoholic beverages for 24 hours or as instructed by your caregiver.  MEDICATIONS  You may resume your normal medications unless your caregiver tells you otherwise.  WHAT YOU CAN EXPECT TODAY  You may experience abdominal discomfort such as a feeling of fullness or "gas" pains.  FOLLOW-UP  Your doctor will discuss the results of your test with you.  SEEK IMMEDIATE MEDICAL ATTENTION IF ANY OF THE FOLLOWING OCCUR:  Excessive nausea (feeling sick to your stomach) and/or vomiting.   Severe abdominal pain and distention (swelling).   Trouble swallowing.   Temperature over 101 F (37.8 C).   Rectal bleeding or vomiting of blood.     Colonoscopy Discharge Instructions  Read the instructions outlined below and refer to this sheet in the next few weeks. These  discharge instructions provide you with general information on caring for yourself after you leave the hospital. Your doctor may also give you specific instructions. While your treatment has been planned according to the most current medical practices available, unavoidable complications occasionally occur.   ACTIVITY  You may resume your regular activity, but move at a slower pace for the next 24 hours.   Take frequent rest periods for the next 24 hours.   Walking will help get rid of the air and reduce the bloated feeling in your belly (abdomen).   No driving for 24 hours (because of the medicine (anesthesia) used during the test).    Do not sign any important legal documents or operate any machinery for 24 hours (because of the anesthesia used during the test).  NUTRITION  Drink plenty of fluids.   You may resume your normal diet as instructed by your doctor.   Begin with a light meal and progress to your normal diet. Heavy or fried foods are harder to digest and may make you feel sick to your stomach (nauseated).   Avoid alcoholic beverages for 24 hours or as instructed.  MEDICATIONS  You may resume your normal medications unless your doctor tells you otherwise.  WHAT YOU CAN EXPECT TODAY  Some feelings of bloating in the abdomen.   Passage of more gas than usual.   Spotting of blood in your stool or on the toilet paper.  IF YOU HAD POLYPS REMOVED DURING THE COLONOSCOPY:  No aspirin products for 7 days or as instructed.  No alcohol for 7 days or as instructed.   Eat a soft diet for the next 24 hours.  FINDING OUT THE RESULTS OF YOUR TEST Not all test results are available during your visit. If your test results are not back during the visit, make an appointment with your caregiver to find out the results. Do not assume everything is normal if you have not heard from your caregiver or the medical facility. It is important for you to follow up on all of your test results.   SEEK IMMEDIATE MEDICAL ATTENTION IF:  You have more than a spotting of blood in your stool.   Your belly is swollen (abdominal distention).   You are nauseated or vomiting.   You have a temperature over 101.   You have abdominal pain or discomfort that is severe or gets worse throughout the day.   Your EGD showed a mild amount inflammation in your stomach.  I took biopsies of this to rule out infection a bacteria called H. pylori.  Otherwise unremarkable.  Your colonoscopy looks good.  I did not find a polyps or evidence of colon cancer.  Mucosa looked very healthy.  Given your age I do not think we need to repeat colonoscopy going forward for colon cancer screening purposes.  Follow-up with GI in 3 months or sooner if needed.  I hope you have a great rest of your week!  Elon Alas. Abbey Chatters, D.O. Gastroenterology and Hepatology [facility]

## 2021-02-23 NOTE — Anesthesia Postprocedure Evaluation (Signed)
Anesthesia Post Note  Patient: Todd Hodge  Procedure(s) Performed: COLONOSCOPY WITH PROPOFOL (N/A ) ESOPHAGOGASTRODUODENOSCOPY (EGD) WITH PROPOFOL (N/A ) BIOPSY  Patient location during evaluation: Phase II Anesthesia Type: General Level of consciousness: awake and alert Pain management: satisfactory to patient Vital Signs Assessment: post-procedure vital signs reviewed and stable Respiratory status: respiratory function stable and spontaneous breathing Cardiovascular status: stable Postop Assessment: no apparent nausea or vomiting Anesthetic complications: no   No complications documented.   Last Vitals:  Vitals:   02/23/21 1315 02/23/21 1318  BP:  (!) 92/56  Pulse: (!) 55   Resp: 18   Temp:    SpO2: 99% 99%    Last Pain:  Vitals:   02/23/21 1315  TempSrc:   PainSc: 0-No pain                 Karna Dupes

## 2021-02-25 LAB — SURGICAL PATHOLOGY

## 2021-03-01 ENCOUNTER — Encounter (HOSPITAL_COMMUNITY): Payer: Self-pay | Admitting: Internal Medicine

## 2021-03-16 ENCOUNTER — Telehealth: Payer: Self-pay | Admitting: Internal Medicine

## 2021-03-16 NOTE — Telephone Encounter (Signed)
Pt returning call. [phone]

## 2021-03-18 DIAGNOSIS — J449 Chronic obstructive pulmonary disease, unspecified: Secondary | ICD-10-CM | POA: Diagnosis not present

## 2021-03-18 DIAGNOSIS — M545 Low back pain, unspecified: Secondary | ICD-10-CM | POA: Diagnosis not present

## 2021-03-18 DIAGNOSIS — Z79899 Other long term (current) drug therapy: Secondary | ICD-10-CM | POA: Diagnosis not present

## 2021-03-18 DIAGNOSIS — Z299 Encounter for prophylactic measures, unspecified: Secondary | ICD-10-CM | POA: Diagnosis not present

## 2021-03-18 DIAGNOSIS — I700 Atherosclerosis of aorta: Secondary | ICD-10-CM | POA: Diagnosis not present

## 2021-03-31 DIAGNOSIS — H2513 Age-related nuclear cataract, bilateral: Secondary | ICD-10-CM | POA: Diagnosis not present

## 2021-03-31 DIAGNOSIS — H5213 Myopia, bilateral: Secondary | ICD-10-CM | POA: Diagnosis not present

## 2021-04-19 DIAGNOSIS — Z299 Encounter for prophylactic measures, unspecified: Secondary | ICD-10-CM | POA: Diagnosis not present

## 2021-04-19 DIAGNOSIS — Z79899 Other long term (current) drug therapy: Secondary | ICD-10-CM | POA: Diagnosis not present

## 2021-04-19 DIAGNOSIS — M545 Low back pain, unspecified: Secondary | ICD-10-CM | POA: Diagnosis not present

## 2021-04-19 DIAGNOSIS — I10 Essential (primary) hypertension: Secondary | ICD-10-CM | POA: Diagnosis not present

## 2021-05-18 DIAGNOSIS — Z299 Encounter for prophylactic measures, unspecified: Secondary | ICD-10-CM | POA: Diagnosis not present

## 2021-05-18 DIAGNOSIS — I10 Essential (primary) hypertension: Secondary | ICD-10-CM | POA: Diagnosis not present

## 2021-05-18 DIAGNOSIS — M545 Low back pain, unspecified: Secondary | ICD-10-CM | POA: Diagnosis not present

## 2021-05-25 ENCOUNTER — Ambulatory Visit: Payer: Medicare PPO | Admitting: Gastroenterology

## 2021-05-25 ENCOUNTER — Ambulatory Visit: Payer: Medicare Other | Admitting: Neurology

## 2021-05-26 ENCOUNTER — Telehealth: Payer: Self-pay | Admitting: *Deleted

## 2021-05-26 ENCOUNTER — Telehealth (INDEPENDENT_AMBULATORY_CARE_PROVIDER_SITE_OTHER): Payer: Medicare PPO | Admitting: Gastroenterology

## 2021-05-26 ENCOUNTER — Telehealth: Payer: Self-pay | Admitting: Gastroenterology

## 2021-05-26 ENCOUNTER — Other Ambulatory Visit: Payer: Self-pay

## 2021-05-26 ENCOUNTER — Encounter: Payer: Self-pay | Admitting: Gastroenterology

## 2021-05-26 DIAGNOSIS — K219 Gastro-esophageal reflux disease without esophagitis: Secondary | ICD-10-CM | POA: Diagnosis not present

## 2021-05-26 DIAGNOSIS — K2270 Barrett's esophagus without dysplasia: Secondary | ICD-10-CM

## 2021-05-26 NOTE — Telephone Encounter (Signed)
Pt consented to a virtual visit. 

## 2021-05-26 NOTE — Patient Instructions (Addendum)
Continue Nexium as before.  We will see you back in one year for follow up. We will plan for repeat upper endoscopy to follow up on Barrett's esophagus in 3 years.  Try a probiotic once daily for 1-2 months to see if that helps regulate your bowels. You can check at your local health store or try one of the following brands [employer], dispensing or [employer]).

## 2021-05-26 NOTE — Telephone Encounter (Signed)
Please mail AVS.  Patient needs follow up ov in one year.

## 2021-05-26 NOTE — Telephone Encounter (Signed)
Todd Hodge, you are scheduled for a virtual visit with your provider today.  Just as we do with appointments in the office, we must obtain your consent to participate.  Your consent will be active for this visit and any virtual visit you may have with one of our providers in the next 365 days.  If you have a MyChart account, I can also send a copy of this consent to you electronically.  All virtual visits are billed to your insurance company just like a traditional visit in the office.  As this is a virtual visit, video technology does not allow for your provider to perform a traditional examination.  This may limit your provider's ability to fully assess your condition.  If your provider identifies any concerns that need to be evaluated in person or the need to arrange testing such as labs, EKG, etc, we will make arrangements to do so.  Although advances in technology are sophisticated, we cannot ensure that it will always work on either your end or our end.  If the connection with a video visit is poor, we may have to switch to a telephone visit.  With either a video or telephone visit, we are not always able to ensure that we have a secure connection.   I need to obtain your verbal consent now.   Are you willing to proceed with your visit today?

## 2021-05-26 NOTE — Telephone Encounter (Signed)
Noted   AVS mailed

## 2021-05-26 NOTE — Progress Notes (Signed)
Primary Care Physician:  Glenda Chroman, MD Primary GI: Elon Alas. Abbey Chatters, DO    Patient Location: Home  Provider Location: Home  Reason for Phone Visit:  Chief Complaint  Patient presents with   pp f/u   Constipation    Comes and goes. Occ diarrhea    Persons present on the phone encounter, with roles: Patient, myself (provider),Martina Booth LPN (updated meds and allergies)  Total time (minutes) spent on medical discussion: 7 minutes  Due to COVID-19, visit was conducted using telephonic method (no video was available).  Visit was requested by patient.  Virtual Visit via Telephone only  I connected with Todd Hodge on 05/26/21 at 11:00 AM EDT by telephone and verified that I am speaking with the correct person using two identifiers.   I discussed the limitations, risks, security and privacy concerns of performing an evaluation and management service by telephone and the availability of in person appointments. I also discussed with the patient that there may be a patient responsible charge related to this service. The patient expressed understanding and agreed to proceed.   HPI:   Patient is a pleasant 70 y/o male who presents for telephone visit regarding postprocedure follow up.   Patient seen in 11/2020 for questionable gastric wall thickening seen on chest CT. Also was due for surveillance colonoscopy for h/o colon adenomas. H/o constipation not managed on Linzess 44mcg. Dose increased to 170mcg daily at last vitis.   EGD 01/2021: - Z-line irregular, 40 cm from the incisors. Biopsied - c/w Barrett's with no dysplasia - Gastritis. Biopsied - gastric oxyntic mucosa with parietal cell hyperplasia as can be seen in hypergastrinemic states such as PPI use. No h.pylori. - Normal duodenal bulb, first portion of the duodenum and second portion of the duodenum. -EGD in 3 years.   Colonoscopy 01/2021: - Non-bleeding internal hemorrhoids. - The examination was otherwise  normal. - No specimens collected. - No future colonoscopies unless new symptoms arise, due to age.  Today: No heartburn. Takes Nexium every day. No dysphagia. No abdominal pain. BM regular now. No need for Linzess. Occasional loose stool. No melena, brbpr. Patient states he is doing well for his age.    Current Outpatient Medications  Medication Sig Dispense Refill   albuterol (VENTOLIN HFA) 108 (90 Base) MCG/ACT inhaler Inhale 2 puffs into the lungs every 6 (six) hours as needed for wheezing or shortness of breath.      aspirin EC 81 MG tablet Take 1 tablet (81 mg total) by mouth daily. (Patient taking differently: Take 81 mg by mouth as needed.)     atenolol (TENORMIN) 50 MG tablet Take 50 mg by mouth daily.      diazepam (VALIUM) 5 MG tablet Take 5 mg by mouth 3 (three) times daily as needed for anxiety.      diclofenac Sodium (VOLTAREN) 1 % GEL Apply 1 application topically 4 (four) times daily as needed (sternum pain).     esomeprazole (NEXIUM) 40 MG capsule Take 40 mg by mouth in the morning and at bedtime.     lisinopril (ZESTRIL) 20 MG tablet Take 20 mg by mouth every evening.     loratadine (CLARITIN) 10 MG tablet Take 10 mg by mouth every evening.     lovastatin (MEVACOR) 20 MG tablet Take 20 mg by mouth at bedtime.     Oxycodone HCl 10 MG TABS Take 10 mg by mouth 4 (four) times daily as needed for pain.  tamsulosin (FLOMAX) 0.4 MG CAPS capsule Take 0.4 mg by mouth in the morning and at bedtime.     No current facility-administered medications for this visit.    Past Medical History:  Diagnosis Date   Anxiety disorder    Avascular necrosis of femur head, left (HCC)    Essential hypertension, benign    GERD (gastroesophageal reflux disease)    History of cardiac catheterization    [place] 2000 - no reported significant CAD   Mitral regurgitation    Mild, echo, 02/2012   Mixed hyperlipidemia    Palpitations    Seasonal allergies    Tobacco abuse     Past Surgical  History:  Procedure Laterality Date   BACK SURGERY     May 2012   BIOPSY  02/23/2021   Procedure: BIOPSY;  Surgeon: Eloise Harman, DO;  [facility];  Service: Endoscopy;;   COLONOSCOPY  09/2016   Dr. Ladona Horns in [place]: three polyps, (2 were tubular adenomas and 1 sessile serrated adenoma).    COLONOSCOPY WITH PROPOFOL N/A 02/23/2021   Procedure: COLONOSCOPY WITH PROPOFOL;  Surgeon: Eloise Harman, DO;  [facility];  Service: Endoscopy;  Laterality: N/A;  pm   ESOPHAGOGASTRODUODENOSCOPY (EGD) WITH PROPOFOL N/A 02/23/2021   Procedure: ESOPHAGOGASTRODUODENOSCOPY (EGD) WITH PROPOFOL;  Surgeon: Eloise Harman, DO;  [facility];  Service: Endoscopy;  Laterality: N/A;   IR KYPHO LUMBAR INC FX REDUCE BONE BX UNI/BIL CANNULATION INC/IMAGING  05/24/2018   NOSE SURGERY     1988    Family History  Problem Relation Age of Onset   Heart failure Mother    Colon cancer Neg Hx     Social History   Socioeconomic History   Marital status: Married    Spouse name: Not on file   Number of children: Not on file   Years of education: Not on file   Highest education level: Not on file  Occupational History   Occupation: Disabled    Comment: Sales  Tobacco Use   Smoking status: Former    Packs/day: 1.00    Years: 25.00    Pack years: 25.00    Types: Cigarettes   Smokeless tobacco: Never   Tobacco comments:    can't remember how long ago he smoked  Vaping Use   Vaping Use: Never used  Substance and Sexual Activity   Alcohol use: No    Comment: occ   Drug use: No   Sexual activity: Not on file  Other Topics Concern   Not on file  Social History Narrative   Not on file   Social Determinants of Health   Financial Resource Strain: Not on file  Food Insecurity: Not on file  Transportation Needs: Not on file  Physical Activity: Not on file  Stress: Not on file  Social Connections: Not on file  Intimate Partner Violence: Not on file       ROS:  General: Negative for anorexia, weight loss, fever, chills, fatigue, weakness. Eyes: Negative for vision changes.  ENT: Negative for hoarseness, difficulty swallowing , nasal congestion. CV: Negative for chest pain, angina, palpitations, dyspnea on exertion, peripheral edema.  Respiratory: Negative for dyspnea at rest, dyspnea on exertion, cough, sputum, wheezing.  GI: See history of present illness. GU:  Negative for dysuria, hematuria, urinary incontinence, urinary frequency, nocturnal urination.  MS: Negative for joint pain, low back pain.  Derm: Negative for rash or itching.  Neuro: Negative for weakness, abnormal sensation,  seizure, frequent headaches, memory loss, confusion.  Psych: Negative for anxiety, depression, suicidal ideation, hallucinations.  Endo: Negative for unusual weight change.  Heme: Negative for bruising or bleeding. Allergy: Negative for rash or hives.   Observations/Objective:   Assessment and Plan:   Barrett's/GERD: doing well on Nexium. Will be due surveillance EGD in 3 years. Return to the office in one year.   Intermittent diarrhea: self-limiting. For most part BMs are regular. No longer dealing with constipation. Occasionally has diarrhea which he feels is food related. Colonoscopy findings reassuring. Denies abdominal pain. Add probiotic daily for 1-2 months. Call if persistent or worsening symptoms.   Follow Up Instructions:    I discussed the assessment and treatment plan with the patient. The patient was provided an opportunity to ask questions and all were answered. The patient agreed with the plan and demonstrated an understanding of the instructions. AVS mailed to patient's home address.   The patient was advised to call back or seek an in-person evaluation if the symptoms worsen or if the condition fails to improve as anticipated.  I provided 7 minutes of non-face-to-face time during this encounter.   Neil Crouch, PA-C

## 2021-05-26 NOTE — Progress Notes (Deleted)
Primary Care Physician:  Glenda Chroman, MD Primary GI: Elon Alas. Abbey Chatters, DO    Patient Location: Home  Provider Location: Home  Reason for Visit:  Chief Complaint  Patient presents with   pp f/u   Constipation    Comes and goes. Occ diarrhea     Persons present on the virtual encounter, with roles: Patient, myself (provider),***(updated meds and allergies)  Total time (minutes) spent on medical discussion: ***  Due to COVID-19, visit was conducted using Mychart video method.  Visit was requested by patient.  Virtual Visit via Mychart video  I connected with Todd Hodge on 05/26/21 at 11:00 AM EDT by Mychart video and verified that I am speaking with the correct person using two identifiers.   I discussed the limitations, risks, security and privacy concerns of performing an evaluation and management service by telephone/video and the availability of in person appointments. I also discussed with the patient that there may be a patient responsible charge related to this service. The patient expressed understanding and agreed to proceed.   HPI:   Todd Hodge is a 70 y.o. male who presents for virtual visit regarding *** f/u EGD/colonoscopy.   Patient seen in 11/2020 for questionable gastric wall thickening seen on chest CT. Also was due for surveillance colonoscopy for h/o colon adenomas. H/o constipation not managed on Linzess 71mcg. Dose increased to 135mcg daily at last vitis.   EGD 01/2021: - Z-line irregular, 40 cm from the incisors. Biopsied - c/w Barrett's with no dysplasia - Gastritis. Biopsied - gastric oxyntic mucosa with parietal cell hyperplasia as can be seen in hypergastrinemic states such as PPI use. No h.pylori. - Normal duodenal bulb, first portion of the duodenum and second portion of the duodenum. -EGD in 3 years.   Colonoscopy 01/2021: - Non-bleeding internal hemorrhoids. - The examination was otherwise normal. - No specimens collected. -  No future colonoscopies unless new symptoms arise, due to age.   No heartburn. No dysphagia. No abdominal pain. BM regular now. No need for Linzess. Occasional loose stool. No melena, brbpr.    Current Outpatient Medications  Medication Sig Dispense Refill   albuterol (VENTOLIN HFA) 108 (90 Base) MCG/ACT inhaler Inhale 2 puffs into the lungs every 6 (six) hours as needed for wheezing or shortness of breath.      aspirin EC 81 MG tablet Take 1 tablet (81 mg total) by mouth daily. (Patient taking differently: Take 81 mg by mouth as needed.)     atenolol (TENORMIN) 50 MG tablet Take 50 mg by mouth daily.      diazepam (VALIUM) 5 MG tablet Take 5 mg by mouth 3 (three) times daily as needed for anxiety.      diclofenac Sodium (VOLTAREN) 1 % GEL Apply 1 application topically 4 (four) times daily as needed (sternum pain).     esomeprazole (NEXIUM) 40 MG capsule Take 40 mg by mouth in the morning and at bedtime.     lisinopril (ZESTRIL) 20 MG tablet Take 20 mg by mouth every evening.     loratadine (CLARITIN) 10 MG tablet Take 10 mg by mouth every evening.     lovastatin (MEVACOR) 20 MG tablet Take 20 mg by mouth at bedtime.     Oxycodone HCl 10 MG TABS Take 10 mg by mouth 4 (four) times daily as needed for pain.     tamsulosin (FLOMAX) 0.4 MG CAPS capsule Take 0.4 mg by mouth in the morning and  at bedtime.     No current facility-administered medications for this visit.    Past Medical History:  Diagnosis Date   Anxiety disorder    Avascular necrosis of femur head, left (HCC)    Essential hypertension, benign    GERD (gastroesophageal reflux disease)    History of cardiac catheterization    [place] 2000 - no reported significant CAD   Mitral regurgitation    Mild, echo, 02/2012   Mixed hyperlipidemia    Palpitations    Seasonal allergies    Tobacco abuse     Past Surgical History:  Procedure Laterality Date   BACK SURGERY     May 2012   BIOPSY  02/23/2021   Procedure: BIOPSY;   Surgeon: Eloise Harman, DO;  [facility];  Service: Endoscopy;;   COLONOSCOPY  09/2016   Dr. Ladona Horns in [place]: three polyps, (2 were tubular adenomas and 1 sessile serrated adenoma).    COLONOSCOPY WITH PROPOFOL N/A 02/23/2021   Procedure: COLONOSCOPY WITH PROPOFOL;  Surgeon: Eloise Harman, DO;  [facility];  Service: Endoscopy;  Laterality: N/A;  pm   ESOPHAGOGASTRODUODENOSCOPY (EGD) WITH PROPOFOL N/A 02/23/2021   Procedure: ESOPHAGOGASTRODUODENOSCOPY (EGD) WITH PROPOFOL;  Surgeon: Eloise Harman, DO;  [facility];  Service: Endoscopy;  Laterality: N/A;   IR KYPHO LUMBAR INC FX REDUCE BONE BX UNI/BIL CANNULATION INC/IMAGING  05/24/2018   NOSE SURGERY     1988    Family History  Problem Relation Age of Onset   Heart failure Mother    Colon cancer Neg Hx     Social History   Socioeconomic History   Marital status: Married    Spouse name: Not on file   Number of children: Not on file   Years of education: Not on file   Highest education level: Not on file  Occupational History   Occupation: Disabled    Comment: Sales  Tobacco Use   Smoking status: Former    Packs/day: 1.00    Years: 25.00    Pack years: 25.00    Types: Cigarettes   Smokeless tobacco: Never   Tobacco comments:    can't remember how long ago he smoked  Vaping Use   Vaping Use: Never used  Substance and Sexual Activity   Alcohol use: No    Comment: occ   Drug use: No   Sexual activity: Not on file  Other Topics Concern   Not on file  Social History Narrative   Not on file   Social Determinants of Health   Financial Resource Strain: Not on file  Food Insecurity: Not on file  Transportation Needs: Not on file  Physical Activity: Not on file  Stress: Not on file  Social Connections: Not on file  Intimate Partner Violence: Not on file      ROS:  General: Negative for anorexia, weight loss, fever, chills, fatigue, weakness. Eyes: Negative for vision  changes.  ENT: Negative for hoarseness, difficulty swallowing , nasal congestion. CV: Negative for chest pain, angina, palpitations, dyspnea on exertion, peripheral edema.  Respiratory: Negative for dyspnea at rest, dyspnea on exertion, cough, sputum, wheezing.  GI: See history of present illness. GU:  Negative for dysuria, hematuria, urinary incontinence, urinary frequency, nocturnal urination.  MS: Negative for joint pain, low back pain.  Derm: Negative for rash or itching.  Neuro: Negative for weakness, abnormal sensation, seizure, frequent headaches, memory loss, confusion.  Psych: Negative for anxiety, depression, suicidal ideation, hallucinations.  Endo: Negative for unusual weight change.  Heme: Negative for bruising or bleeding. Allergy: Negative for rash or hives.   Observations/Objective:   Assessment and Plan:   Follow Up Instructions:    I discussed the assessment and treatment plan with the patient. The patient was provided an opportunity to ask questions and all were answered. The patient agreed with the plan and demonstrated an understanding of the instructions. AVS mailed to patient's home address.   The patient was advised to call back or seek an in-person evaluation if the symptoms worsen or if the condition fails to improve as anticipated.  I provided *** minutes of virtual face-to-face time during this encounter.   Neil Crouch, PA-C

## 2021-06-16 DIAGNOSIS — Z299 Encounter for prophylactic measures, unspecified: Secondary | ICD-10-CM | POA: Diagnosis not present

## 2021-06-16 DIAGNOSIS — I10 Essential (primary) hypertension: Secondary | ICD-10-CM | POA: Diagnosis not present

## 2021-06-16 DIAGNOSIS — Z79899 Other long term (current) drug therapy: Secondary | ICD-10-CM | POA: Diagnosis not present

## 2021-06-16 DIAGNOSIS — J449 Chronic obstructive pulmonary disease, unspecified: Secondary | ICD-10-CM | POA: Diagnosis not present

## 2021-06-16 DIAGNOSIS — M545 Low back pain, unspecified: Secondary | ICD-10-CM | POA: Diagnosis not present

## 2021-07-05 DIAGNOSIS — H2513 Age-related nuclear cataract, bilateral: Secondary | ICD-10-CM | POA: Diagnosis not present

## 2021-07-05 DIAGNOSIS — H2512 Age-related nuclear cataract, left eye: Secondary | ICD-10-CM | POA: Diagnosis not present

## 2021-07-13 ENCOUNTER — Ambulatory Visit: Payer: Medicare PPO | Admitting: Urology

## 2021-07-16 DIAGNOSIS — M545 Low back pain, unspecified: Secondary | ICD-10-CM | POA: Diagnosis not present

## 2021-07-16 DIAGNOSIS — Z299 Encounter for prophylactic measures, unspecified: Secondary | ICD-10-CM | POA: Diagnosis not present

## 2021-07-16 DIAGNOSIS — J45909 Unspecified asthma, uncomplicated: Secondary | ICD-10-CM | POA: Diagnosis not present

## 2021-07-16 DIAGNOSIS — I10 Essential (primary) hypertension: Secondary | ICD-10-CM | POA: Diagnosis not present

## 2021-08-05 DIAGNOSIS — R509 Fever, unspecified: Secondary | ICD-10-CM | POA: Diagnosis not present

## 2021-08-05 DIAGNOSIS — Z20822 Contact with and (suspected) exposure to covid-19: Secondary | ICD-10-CM | POA: Diagnosis not present

## 2021-08-05 DIAGNOSIS — J069 Acute upper respiratory infection, unspecified: Secondary | ICD-10-CM | POA: Diagnosis not present

## 2021-08-05 DIAGNOSIS — R070 Pain in throat: Secondary | ICD-10-CM | POA: Diagnosis not present

## 2021-08-11 DIAGNOSIS — E7800 Pure hypercholesterolemia, unspecified: Secondary | ICD-10-CM | POA: Diagnosis not present

## 2021-08-11 DIAGNOSIS — Z7189 Other specified counseling: Secondary | ICD-10-CM | POA: Diagnosis not present

## 2021-08-11 DIAGNOSIS — I10 Essential (primary) hypertension: Secondary | ICD-10-CM | POA: Diagnosis not present

## 2021-08-11 DIAGNOSIS — E039 Hypothyroidism, unspecified: Secondary | ICD-10-CM | POA: Diagnosis not present

## 2021-08-11 DIAGNOSIS — Z1331 Encounter for screening for depression: Secondary | ICD-10-CM | POA: Diagnosis not present

## 2021-08-11 DIAGNOSIS — R5383 Other fatigue: Secondary | ICD-10-CM | POA: Diagnosis not present

## 2021-08-11 DIAGNOSIS — Z6824 Body mass index (BMI) 24.0-24.9, adult: Secondary | ICD-10-CM | POA: Diagnosis not present

## 2021-08-11 DIAGNOSIS — Z299 Encounter for prophylactic measures, unspecified: Secondary | ICD-10-CM | POA: Diagnosis not present

## 2021-08-11 DIAGNOSIS — Z125 Encounter for screening for malignant neoplasm of prostate: Secondary | ICD-10-CM | POA: Diagnosis not present

## 2021-08-11 DIAGNOSIS — Z0000 Encounter for general adult medical examination without abnormal findings: Secondary | ICD-10-CM | POA: Diagnosis not present

## 2021-08-11 DIAGNOSIS — Z79899 Other long term (current) drug therapy: Secondary | ICD-10-CM | POA: Diagnosis not present

## 2021-08-11 DIAGNOSIS — R6884 Jaw pain: Secondary | ICD-10-CM | POA: Diagnosis not present

## 2021-08-11 DIAGNOSIS — Z1339 Encounter for screening examination for other mental health and behavioral disorders: Secondary | ICD-10-CM | POA: Diagnosis not present

## 2021-08-11 DIAGNOSIS — M545 Low back pain, unspecified: Secondary | ICD-10-CM | POA: Diagnosis not present

## 2021-09-10 DIAGNOSIS — Z79899 Other long term (current) drug therapy: Secondary | ICD-10-CM | POA: Diagnosis not present

## 2021-09-10 DIAGNOSIS — I10 Essential (primary) hypertension: Secondary | ICD-10-CM | POA: Diagnosis not present

## 2021-09-10 DIAGNOSIS — Z299 Encounter for prophylactic measures, unspecified: Secondary | ICD-10-CM | POA: Diagnosis not present

## 2021-09-10 DIAGNOSIS — M545 Low back pain, unspecified: Secondary | ICD-10-CM | POA: Diagnosis not present

## 2021-09-10 DIAGNOSIS — I700 Atherosclerosis of aorta: Secondary | ICD-10-CM | POA: Diagnosis not present

## 2021-09-10 DIAGNOSIS — J449 Chronic obstructive pulmonary disease, unspecified: Secondary | ICD-10-CM | POA: Diagnosis not present

## 2021-09-17 DIAGNOSIS — K8020 Calculus of gallbladder without cholecystitis without obstruction: Secondary | ICD-10-CM | POA: Diagnosis not present

## 2021-09-17 DIAGNOSIS — I700 Atherosclerosis of aorta: Secondary | ICD-10-CM | POA: Diagnosis not present

## 2021-09-17 DIAGNOSIS — R932 Abnormal findings on diagnostic imaging of liver and biliary tract: Secondary | ICD-10-CM | POA: Diagnosis not present

## 2021-09-17 DIAGNOSIS — M160 Bilateral primary osteoarthritis of hip: Secondary | ICD-10-CM | POA: Diagnosis not present

## 2021-09-17 DIAGNOSIS — M5137 Other intervertebral disc degeneration, lumbosacral region: Secondary | ICD-10-CM | POA: Diagnosis not present

## 2021-09-17 DIAGNOSIS — R945 Abnormal results of liver function studies: Secondary | ICD-10-CM | POA: Diagnosis not present

## 2021-09-24 DIAGNOSIS — Z87891 Personal history of nicotine dependence: Secondary | ICD-10-CM | POA: Diagnosis not present

## 2021-09-24 DIAGNOSIS — I700 Atherosclerosis of aorta: Secondary | ICD-10-CM | POA: Diagnosis not present

## 2021-09-24 DIAGNOSIS — Z299 Encounter for prophylactic measures, unspecified: Secondary | ICD-10-CM | POA: Diagnosis not present

## 2021-09-24 DIAGNOSIS — I10 Essential (primary) hypertension: Secondary | ICD-10-CM | POA: Diagnosis not present

## 2021-09-24 DIAGNOSIS — R9389 Abnormal findings on diagnostic imaging of other specified body structures: Secondary | ICD-10-CM | POA: Diagnosis not present

## 2021-09-24 DIAGNOSIS — Z713 Dietary counseling and surveillance: Secondary | ICD-10-CM | POA: Diagnosis not present

## 2021-09-24 DIAGNOSIS — Z6825 Body mass index (BMI) 25.0-25.9, adult: Secondary | ICD-10-CM | POA: Diagnosis not present

## 2021-09-24 DIAGNOSIS — J449 Chronic obstructive pulmonary disease, unspecified: Secondary | ICD-10-CM | POA: Diagnosis not present

## 2021-09-30 ENCOUNTER — Encounter: Payer: Self-pay | Admitting: Internal Medicine

## 2021-10-11 DIAGNOSIS — R21 Rash and other nonspecific skin eruption: Secondary | ICD-10-CM | POA: Diagnosis not present

## 2021-10-11 DIAGNOSIS — Z299 Encounter for prophylactic measures, unspecified: Secondary | ICD-10-CM | POA: Diagnosis not present

## 2021-10-11 DIAGNOSIS — N3281 Overactive bladder: Secondary | ICD-10-CM | POA: Diagnosis not present

## 2021-10-11 DIAGNOSIS — R7309 Other abnormal glucose: Secondary | ICD-10-CM | POA: Diagnosis not present

## 2021-10-11 DIAGNOSIS — M545 Low back pain, unspecified: Secondary | ICD-10-CM | POA: Diagnosis not present

## 2021-10-19 DIAGNOSIS — H2512 Age-related nuclear cataract, left eye: Secondary | ICD-10-CM | POA: Diagnosis not present

## 2021-11-04 ENCOUNTER — Ambulatory Visit: Payer: Medicare PPO | Admitting: Internal Medicine

## 2021-11-08 DIAGNOSIS — H2511 Age-related nuclear cataract, right eye: Secondary | ICD-10-CM | POA: Diagnosis not present

## 2021-11-10 DIAGNOSIS — M545 Low back pain, unspecified: Secondary | ICD-10-CM | POA: Diagnosis not present

## 2021-11-10 DIAGNOSIS — J029 Acute pharyngitis, unspecified: Secondary | ICD-10-CM | POA: Diagnosis not present

## 2021-11-10 DIAGNOSIS — I10 Essential (primary) hypertension: Secondary | ICD-10-CM | POA: Diagnosis not present

## 2021-11-10 DIAGNOSIS — Z299 Encounter for prophylactic measures, unspecified: Secondary | ICD-10-CM | POA: Diagnosis not present

## 2021-12-09 ENCOUNTER — Ambulatory Visit: Payer: Medicare PPO | Admitting: Internal Medicine

## 2021-12-09 ENCOUNTER — Encounter: Payer: Self-pay | Admitting: Internal Medicine

## 2021-12-10 DIAGNOSIS — Z299 Encounter for prophylactic measures, unspecified: Secondary | ICD-10-CM | POA: Diagnosis not present

## 2021-12-10 DIAGNOSIS — Z79899 Other long term (current) drug therapy: Secondary | ICD-10-CM | POA: Diagnosis not present

## 2021-12-10 DIAGNOSIS — M545 Low back pain, unspecified: Secondary | ICD-10-CM | POA: Diagnosis not present

## 2021-12-10 DIAGNOSIS — N183 Chronic kidney disease, stage 3 unspecified: Secondary | ICD-10-CM | POA: Diagnosis not present

## 2021-12-10 DIAGNOSIS — J449 Chronic obstructive pulmonary disease, unspecified: Secondary | ICD-10-CM | POA: Diagnosis not present

## 2021-12-10 DIAGNOSIS — I700 Atherosclerosis of aorta: Secondary | ICD-10-CM | POA: Diagnosis not present

## 2022-01-11 DIAGNOSIS — F411 Generalized anxiety disorder: Secondary | ICD-10-CM | POA: Diagnosis not present

## 2022-01-11 DIAGNOSIS — Z299 Encounter for prophylactic measures, unspecified: Secondary | ICD-10-CM | POA: Diagnosis not present

## 2022-01-11 DIAGNOSIS — M545 Low back pain, unspecified: Secondary | ICD-10-CM | POA: Diagnosis not present

## 2022-01-11 DIAGNOSIS — I10 Essential (primary) hypertension: Secondary | ICD-10-CM | POA: Diagnosis not present

## 2022-01-11 DIAGNOSIS — J449 Chronic obstructive pulmonary disease, unspecified: Secondary | ICD-10-CM | POA: Diagnosis not present

## 2022-02-08 DIAGNOSIS — M545 Low back pain, unspecified: Secondary | ICD-10-CM | POA: Diagnosis not present

## 2022-02-08 DIAGNOSIS — F411 Generalized anxiety disorder: Secondary | ICD-10-CM | POA: Diagnosis not present

## 2022-02-08 DIAGNOSIS — Z299 Encounter for prophylactic measures, unspecified: Secondary | ICD-10-CM | POA: Diagnosis not present

## 2022-02-08 DIAGNOSIS — E7800 Pure hypercholesterolemia, unspecified: Secondary | ICD-10-CM | POA: Diagnosis not present

## 2022-02-08 DIAGNOSIS — I10 Essential (primary) hypertension: Secondary | ICD-10-CM | POA: Diagnosis not present

## 2022-03-02 ENCOUNTER — Encounter: Payer: Self-pay | Admitting: *Deleted

## 2022-03-02 ENCOUNTER — Telehealth: Payer: Self-pay | Admitting: *Deleted

## 2022-03-02 ENCOUNTER — Ambulatory Visit (INDEPENDENT_AMBULATORY_CARE_PROVIDER_SITE_OTHER): Payer: Medicare PPO | Admitting: Internal Medicine

## 2022-03-02 VITALS — BP 116/64 | HR 68 | Temp 97.3°F | Ht 69.0 in | Wt 159.4 lb

## 2022-03-02 DIAGNOSIS — K838 Other specified diseases of biliary tract: Secondary | ICD-10-CM | POA: Diagnosis not present

## 2022-03-02 DIAGNOSIS — K219 Gastro-esophageal reflux disease without esophagitis: Secondary | ICD-10-CM | POA: Diagnosis not present

## 2022-03-02 DIAGNOSIS — R932 Abnormal findings on diagnostic imaging of liver and biliary tract: Secondary | ICD-10-CM

## 2022-03-02 NOTE — Telephone Encounter (Signed)
PA approved via [employer]. Auth# [id number], DOS: Mar 03 2022 - Apr 02 2022 ?

## 2022-03-02 NOTE — Patient Instructions (Signed)
I am going to order blood work today to evaluate your liver. ? ?I am also going to order an MRI to further evaluate the spot of your liver seen on CT scan.  We will call you with these results. ? ?It was very nice seeing you again today. ? ?Dr. Abbey Chatters ?

## 2022-03-02 NOTE — Progress Notes (Signed)
? ? ?Referring Provider: Glenda Chroman, MD ?Primary Care Physician:  Glenda Chroman, MD ?Primary GI:  Dr. Abbey Chatters ? ?Chief Complaint  ?Patient presents with  ? Follow-up  ?  RM #3--spot on liver  ? ? ?HPI:   ?Todd Hodge is a 71 y.o. male who presents to clinic today for follow-up visit.  Has a history of chronic GERD for which she takes Nexium every other day.  Well-controlled.  No dysphagia odynophagia, epigastric pain.  History of gastric wall thickening on chest CT.  Also history of adenomatous colon polyps. ? ?EGD 01/2021: ?- Z-line irregular, 40 cm from the incisors. Biopsied - c/w Barrett's with no dysplasia ?- Gastritis. Biopsied - gastric oxyntic mucosa with parietal cell hyperplasia as can be seen in hypergastrinemic states such as PPI use. No h.pylori. ?- Normal duodenal bulb, first portion of the duodenum and second portion of the ?duodenum. ?-EGD in 3 years.  ?  ?Colonoscopy 01/2021: ?- Non-bleeding internal hemorrhoids. ?- The examination was otherwise normal. ?- No specimens collected. ?- No future colonoscopies unless new symptoms arise, due to age. ? ?Here today due to abnormal CT scan which was performed at [facility] due to abnormal LFTs on 09/17/21.  I do not have these liver numbers to evaluate.  CT showed mild intra and extrahepatic biliary ductal dilatation without calcified gallstones.  Recommended MRCP to exclude ampullary lesion and noncalcified stones. ? ?Past Medical History:  ?Diagnosis Date  ? Anxiety disorder   ? Avascular necrosis of femur head, left ([place])   ? Essential hypertension, benign   ? GERD (gastroesophageal reflux disease)   ? History of cardiac catheterization   ? [place] 2000 - no reported significant CAD  ? Mitral regurgitation   ? Mild, echo, 02/2012  ? Mixed hyperlipidemia   ? Palpitations   ? Seasonal allergies   ? Tobacco abuse   ? ? ?Past Surgical History:  ?Procedure Laterality Date  ? BACK SURGERY    ? May 2012  ? BIOPSY  02/23/2021  ? Procedure: BIOPSY;   Surgeon: Eloise Harman, DO;  [facility];  Service: Endoscopy;;  ? COLONOSCOPY  09/2016  ? Dr. Ladona Horns in [place]: three polyps, (2 were tubular adenomas and 1 sessile serrated adenoma).   ? COLONOSCOPY WITH PROPOFOL N/A 02/23/2021  ? Procedure: COLONOSCOPY WITH PROPOFOL;  Surgeon: Eloise Harman, DO;  [facility];  Service: Endoscopy;  Laterality: N/A;  pm  ? ESOPHAGOGASTRODUODENOSCOPY (EGD) WITH PROPOFOL N/A 02/23/2021  ? Procedure: ESOPHAGOGASTRODUODENOSCOPY (EGD) WITH PROPOFOL;  Surgeon: Eloise Harman, DO;  [facility];  Service: Endoscopy;  Laterality: N/A;  ? IR KYPHO LUMBAR INC FX REDUCE BONE BX UNI/BIL CANNULATION INC/IMAGING  05/24/2018  ? NOSE SURGERY    ? 1988  ? ? ?Current Outpatient Medications  ?Medication Sig Dispense Refill  ? albuterol (VENTOLIN HFA) 108 (90 Base) MCG/ACT inhaler Inhale 2 puffs into the lungs every 6 (six) hours as needed for wheezing or shortness of breath.     ? aspirin EC 81 MG tablet Take 1 tablet (81 mg total) by mouth daily. (Patient taking differently: Take 81 mg by mouth as needed.)    ? atenolol (TENORMIN) 50 MG tablet Take 50 mg by mouth daily.     ? diazepam (VALIUM) 5 MG tablet Take 5 mg by mouth 3 (three) times daily as needed for anxiety.     ? diclofenac Sodium (VOLTAREN) 1 % GEL Apply 1 application topically  4 (four) times daily as needed (sternum pain).    ? lisinopril (ZESTRIL) 20 MG tablet Take 20 mg by mouth every evening.    ? loratadine (CLARITIN) 10 MG tablet Take 10 mg by mouth every evening.    ? lovastatin (MEVACOR) 20 MG tablet Take 20 mg by mouth at bedtime.    ? Oxycodone HCl 10 MG TABS Take 10 mg by mouth 4 (four) times daily as needed for pain.    ? tamsulosin (FLOMAX) 0.4 MG CAPS capsule Take 0.4 mg by mouth in the morning and at bedtime.    ? esomeprazole (NEXIUM) 40 MG capsule Take 40 mg by mouth in the morning and at bedtime. (Patient not taking: Reported on 03/02/2022)    ? ?No current facility-administered  medications for this visit.  ? ? ?Allergies as of 03/02/2022 - Review Complete 03/02/2022  ?Allergen Reaction Noted  ? Penicillins Other (See Comments) 03/07/2012  ? Doxycycline Rash 03/07/2012  ? ? ?Family History  ?Problem Relation Age of Onset  ? Heart failure Mother   ? Colon cancer Neg Hx   ? ? ?Social History  ? ?Socioeconomic History  ? Marital status: Married  ?  Spouse name: Not on file  ? Number of children: Not on file  ? Years of education: Not on file  ? Highest education level: Not on file  ?Occupational History  ? 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 Smoking status: Former  ?  Packs/day: 1.00  ?  Years: 25.00  ?  Pack years: 25.00  ?  Types: Cigarettes  ? Smokeless tobacco: Never  ? Tobacco comments:  ?  can't remember how long ago he smoked  ?Vaping Use  ? Vaping Use: Never used  ?Substance and Sexual Activity  ? Alcohol use: No  ?  Comment: occ  ? Drug use: No  ? Sexual activity: Not on file  ?Other Topics Concern  ? Not on file  ?Social History Narrative  ? Not on file  ? ?Social Determinants of Health  ? ?Financial Resource Strain: Not on file  ?Food Insecurity: Not on file  ?Transportation Needs: Not on file  ?Physical Activity: Not on file  ?Stress: Not on file  ?Social Connections: Not on file  ? ? ?Subjective: ?Review of Systems  ?Constitutional:  Negative for chills and fever.  ?HENT:  Negative for congestion and hearing loss.   ?Eyes:  Negative for blurred vision and double vision.  ?Respiratory:  Negative for cough and shortness of breath.   ?Cardiovascular:  Negative for chest pain and palpitations.  ?Gastrointestinal:  Negative for abdominal pain, blood in stool, constipation, diarrhea, heartburn, melena and vomiting.  ?Genitourinary:  Negative for dysuria and urgency.  ?Musculoskeletal:  Negative for joint pain and myalgias.  ?Skin:  Negative for itching and rash.  ?Neurological:  Negative for dizziness and headaches.  ?Psychiatric/Behavioral:  Negative for  depression. The patient is not nervous/anxious.   ? ? ?Objective: ?BP 116/64   Pulse 68   Temp (!) 97.3 ?F (36.3 ?C)   Ht '5\' 9"'$  (1.753 m)   Wt 159 lb 6.4 oz (72.3 kg)   BMI 23.54 kg/m?  ?Physical Exam ?Constitutional:   ?   Appearance: Normal appearance.  ?HENT:  ?   Head: Normocephalic and atraumatic.  ?Eyes:  ?   Extraocular Movements: Extraocular movements intact.  ?   Conjunctiva/sclera: Conjunctivae normal.  ?Cardiovascular:  ?   Rate and Rhythm: Normal rate and regular  rhythm.  ?Pulmonary:  ?   Effort: Pulmonary effort is normal.  ?   Breath sounds: Normal breath sounds.  ?Abdominal:  ?   General: Bowel sounds are normal.  ?   Palpations: Abdomen is soft.  ?Musculoskeletal:     ?   General: Normal range of motion.  ?   Cervical back: Normal range of motion and neck supple.  ?Skin: ?   General: Skin is warm.  ?Neurological:  ?   General: No focal deficit present.  ?   Mental Status: He is alert and oriented to person, place, and time.  ?Psychiatric:     ?   Mood and Affect: Mood normal.     ?   Behavior: Behavior normal.  ? ? ? ?Assessment: ?*GERD-well-controlled on Nexium ?*Biliary ductal dilatation-etiology unclear ?*Reported abnormal LFTs ? ?Plan: ?GERD well-controlled on Nexium.  We will continue. ? ?In regards to patient's abnormal CT scan showing biliary ductal dilatation as well as reported abnormal LFTs, will order MRI/MRCP to further evaluate. ? ?I will also check CMP today as well. ? ?Further recommendations to follow. ? ?03/02/2022 11:32 AM ? ? ?Disclaimer: This note was dictated with voice recognition software. Similar sounding words can inadvertently be transcribed and may not be corrected upon review. ? ?

## 2022-03-03 LAB — COMPLETE METABOLIC PANEL WITH GFR
AG Ratio: 1.5 (calc) (ref 1.0–2.5)
ALT: 57 U/L — ABNORMAL HIGH (ref 9–46)
AST: 28 U/L (ref 10–35)
Albumin: 4.3 g/dL (ref 3.6–5.1)
Alkaline phosphatase (APISO): 267 U/L — ABNORMAL HIGH (ref 35–144)
BUN/Creatinine Ratio: 8 (calc) (ref 6–22)
BUN: 11 mg/dL (ref 7–25)
CO2: 26 mmol/L (ref 20–32)
Calcium: 9.2 mg/dL (ref 8.6–10.3)
Chloride: 98 mmol/L (ref 98–110)
Creat: 1.34 mg/dL — ABNORMAL HIGH (ref 0.70–1.28)
Globulin: 2.9 g/dL (calc) (ref 1.9–3.7)
Glucose, Bld: 86 mg/dL (ref 65–99)
Potassium: 3.6 mmol/L (ref 3.5–5.3)
Sodium: 135 mmol/L (ref 135–146)
Total Bilirubin: 0.8 mg/dL (ref 0.2–1.2)
Total Protein: 7.2 g/dL (ref 6.1–8.1)
eGFR: 56 mL/min/{1.73_m2} — ABNORMAL LOW (ref >=60)

## 2022-03-07 ENCOUNTER — Telehealth: Payer: Self-pay | Admitting: Internal Medicine

## 2022-03-07 NOTE — Telephone Encounter (Signed)
See lab result note.

## 2022-03-07 NOTE — Telephone Encounter (Signed)
Pt returning call from [place] about his lab results. Please call [phone] ?

## 2022-03-10 DIAGNOSIS — M545 Low back pain, unspecified: Secondary | ICD-10-CM | POA: Diagnosis not present

## 2022-03-10 DIAGNOSIS — Z299 Encounter for prophylactic measures, unspecified: Secondary | ICD-10-CM | POA: Diagnosis not present

## 2022-03-10 DIAGNOSIS — F1120 Opioid dependence, uncomplicated: Secondary | ICD-10-CM | POA: Diagnosis not present

## 2022-03-10 DIAGNOSIS — Z79899 Other long term (current) drug therapy: Secondary | ICD-10-CM | POA: Diagnosis not present

## 2022-03-10 DIAGNOSIS — I10 Essential (primary) hypertension: Secondary | ICD-10-CM | POA: Diagnosis not present

## 2022-03-18 ENCOUNTER — Other Ambulatory Visit (HOSPITAL_COMMUNITY): Payer: Medicare PPO

## 2022-03-24 ENCOUNTER — Ambulatory Visit (HOSPITAL_COMMUNITY): Payer: Medicare PPO

## 2022-03-29 DIAGNOSIS — I700 Atherosclerosis of aorta: Secondary | ICD-10-CM | POA: Diagnosis not present

## 2022-03-29 DIAGNOSIS — I10 Essential (primary) hypertension: Secondary | ICD-10-CM | POA: Diagnosis not present

## 2022-03-29 DIAGNOSIS — K602 Anal fissure, unspecified: Secondary | ICD-10-CM | POA: Diagnosis not present

## 2022-03-29 DIAGNOSIS — Z299 Encounter for prophylactic measures, unspecified: Secondary | ICD-10-CM | POA: Diagnosis not present

## 2022-03-29 DIAGNOSIS — J449 Chronic obstructive pulmonary disease, unspecified: Secondary | ICD-10-CM | POA: Diagnosis not present

## 2022-04-05 DIAGNOSIS — Z736 Limitation of activities due to disability: Secondary | ICD-10-CM | POA: Diagnosis not present

## 2022-04-06 ENCOUNTER — Telehealth: Payer: Self-pay | Admitting: Internal Medicine

## 2022-04-06 NOTE — Telephone Encounter (Signed)
PA done again. Auth# [id number], DOS: Apr 11 2022 - May 11 2022 ?

## 2022-04-06 NOTE — Telephone Encounter (Signed)
[name] from the pre service center [facility] that patient's MRI Todd Hodge has expired. Please call [phone] ext [phone] ?

## 2022-04-08 DIAGNOSIS — F411 Generalized anxiety disorder: Secondary | ICD-10-CM | POA: Diagnosis not present

## 2022-04-08 DIAGNOSIS — Z299 Encounter for prophylactic measures, unspecified: Secondary | ICD-10-CM | POA: Diagnosis not present

## 2022-04-08 DIAGNOSIS — I10 Essential (primary) hypertension: Secondary | ICD-10-CM | POA: Diagnosis not present

## 2022-04-08 DIAGNOSIS — M545 Low back pain, unspecified: Secondary | ICD-10-CM | POA: Diagnosis not present

## 2022-04-11 ENCOUNTER — Ambulatory Visit (HOSPITAL_COMMUNITY): Payer: Medicare PPO

## 2022-04-14 ENCOUNTER — Encounter: Payer: Self-pay | Admitting: Internal Medicine

## 2022-04-28 ENCOUNTER — Other Ambulatory Visit: Payer: Self-pay | Admitting: Internal Medicine

## 2022-04-28 ENCOUNTER — Ambulatory Visit (HOSPITAL_COMMUNITY)
Admission: RE | Admit: 2022-04-28 | Discharge: 2022-04-28 | Disposition: A | Discharge status: Home / Self Care | Payer: Medicare PPO | Source: Ambulatory Visit | Attending: Internal Medicine | Admitting: Internal Medicine

## 2022-04-28 DIAGNOSIS — R7989 Other specified abnormal findings of blood chemistry: Secondary | ICD-10-CM | POA: Diagnosis not present

## 2022-04-28 DIAGNOSIS — K838 Other specified diseases of biliary tract: Secondary | ICD-10-CM | POA: Diagnosis not present

## 2022-04-28 DIAGNOSIS — K8050 Calculus of bile duct without cholangitis or cholecystitis without obstruction: Secondary | ICD-10-CM | POA: Diagnosis not present

## 2022-04-28 DIAGNOSIS — R932 Abnormal findings on diagnostic imaging of liver and biliary tract: Secondary | ICD-10-CM

## 2022-04-28 DIAGNOSIS — K8070 Calculus of gallbladder and bile duct without cholecystitis without obstruction: Secondary | ICD-10-CM | POA: Diagnosis not present

## 2022-04-28 MED ORDER — GADOBUTROL 1 MMOL/ML IV SOLN
7.0000 mL | Freq: Once | INTRAVENOUS | Status: AC | PRN
Start: 2022-04-28 — End: 2022-04-28
  Administered 2022-04-28: 7 mL via INTRAVENOUS

## 2022-05-06 ENCOUNTER — Telehealth: Payer: Self-pay

## 2022-05-06 ENCOUNTER — Other Ambulatory Visit: Payer: Self-pay

## 2022-05-06 ENCOUNTER — Other Ambulatory Visit: Payer: Self-pay | Admitting: Internal Medicine

## 2022-05-06 DIAGNOSIS — R7989 Other specified abnormal findings of blood chemistry: Secondary | ICD-10-CM

## 2022-05-06 DIAGNOSIS — K838 Other specified diseases of biliary tract: Secondary | ICD-10-CM

## 2022-05-06 NOTE — Progress Notes (Signed)
Repeat LFTs ordered to [facility]

## 2022-05-06 NOTE — Telephone Encounter (Signed)
June 19 since we will be rescheduling our first ERCP due to the cardiac issues that we had previously discussed. Otherwise, it may still be possible to have this patient's procedure done in the afternoon on June 19.   1 pm [facility] ERCP scheduled for 6/19  ERCP scheduled, pt instructed and medications reviewed.  Patient instructions mailed to home.  Patient to call with any questions or concerns.

## 2022-05-06 NOTE — Telephone Encounter (Signed)
-----  Message from Eloise Harman, DO sent at 05/06/2022 12:26 PM EDT ----- Todd Hodge, yes I spoke with him.  He is agreeable to being set up for ERCP.  Also states he will go to lab to have repeat blood work done which I will order.  Thank you for your help!  ----- Message ----- From: Timothy Lasso, RN Sent: 05/06/2022   9:09 AM EDT To: Eloise Harman, DO  Dr Abbey Chatters have you spoken with this pt in regards to ERCP.  ----- Message ----- From: Timothy Lasso, RN Sent: 05/06/2022  12:00 AM EDT To: Timothy Lasso, RN  Waiting for Dr Abbey Chatters ----- Message ----- From: Timothy Lasso, RN Sent: 05/03/2022  12:00 AM EDT To: Timothy Lasso, RN   ----- Message ----- From: Irving Copas., MD Sent: 05/01/2022   9:53 PM EDT To: Milus Banister, MD, Timothy Lasso, RN, #  [place], Sounds like we need to get him in for an ERCP. Please have him get updated labs (hepatic function panel) at your convenience. I have a patient that is going to likely be rescheduled so we can try to get him in with me in the next couple of weeks.  DJ and I are filled for the next couple of months on procedures but we will do our best to try if this patient in. If he agrees to moving forward with ERCP, then please let us know (reply all) and we will have Bani Gianfrancesco work on scheduling this. Certainly, if the LFTs are significantly elevated or if he has become jaundiced significantly, and we cannot accommodate this as an outpatient he may need to be admitted for inpatient ERCP.  Shakira Los, When Dr. Abbey Chatters replies, please try to place this patient on for June 19 since we will be rescheduling our first ERCP due to the cardiac issues that we had previously discussed. Otherwise, it may still be possible to have this patient's procedure done in the afternoon on June 19. Thanks. GM  ----- Message ----- From: Eloise Harman, DO Sent: 05/01/2022   8:27 PM EDT To: Milus Banister, MD, #  GM and DJ,  I wanted to run this patient  by you guys.  Presented to my office in April for visit for abnormal CT scan.  Had a CT at [facility] October  2022 which showed intra and extrahepatic biliary dilatation.  Study was apparently performed due to abnormal LFTs.  Saw me in April.  I repeated LFTs which showed AST 28, ALT 57, alk phos 267, T. bili 0.8.  I ordered MRI/MRCP.  Patient canceled and rescheduled.  Had this performed 04/28/2022 which showed mild biliary ductal dilatation with 7 mm stone in distal CBD.  No evidence of cholecystitis.  He was totally asymptomatic when I saw him in clinic a few months ago.  Wanted to see if you thought he would benefit from ERCP?  Thanks [facility]

## 2022-05-09 DIAGNOSIS — Z299 Encounter for prophylactic measures, unspecified: Secondary | ICD-10-CM | POA: Diagnosis not present

## 2022-05-09 DIAGNOSIS — I10 Essential (primary) hypertension: Secondary | ICD-10-CM | POA: Diagnosis not present

## 2022-05-09 DIAGNOSIS — J449 Chronic obstructive pulmonary disease, unspecified: Secondary | ICD-10-CM | POA: Diagnosis not present

## 2022-05-09 DIAGNOSIS — M545 Low back pain, unspecified: Secondary | ICD-10-CM | POA: Diagnosis not present

## 2022-05-09 DIAGNOSIS — Z79899 Other long term (current) drug therapy: Secondary | ICD-10-CM | POA: Diagnosis not present

## 2022-05-13 ENCOUNTER — Other Ambulatory Visit: Payer: Self-pay

## 2022-05-13 ENCOUNTER — Encounter (HOSPITAL_COMMUNITY): Payer: Self-pay | Admitting: Gastroenterology

## 2022-05-16 ENCOUNTER — Ambulatory Visit (HOSPITAL_COMMUNITY): Payer: Medicare PPO

## 2022-05-16 ENCOUNTER — Ambulatory Visit (HOSPITAL_BASED_OUTPATIENT_CLINIC_OR_DEPARTMENT_OTHER): Payer: Medicare PPO | Admitting: Anesthesiology

## 2022-05-16 ENCOUNTER — Ambulatory Visit (HOSPITAL_COMMUNITY): Payer: Medicare PPO | Admitting: Anesthesiology

## 2022-05-16 ENCOUNTER — Encounter (HOSPITAL_COMMUNITY)
Admission: RE | Disposition: A | Discharge status: Home / Self Care | Payer: Self-pay | Source: Home / Self Care | Attending: Gastroenterology

## 2022-05-16 ENCOUNTER — Ambulatory Visit (HOSPITAL_COMMUNITY)
Admission: RE | Admit: 2022-05-16 | Discharge: 2022-05-16 | Disposition: A | Discharge status: Home / Self Care | Payer: Medicare PPO | Attending: Gastroenterology | Admitting: Gastroenterology

## 2022-05-16 ENCOUNTER — Other Ambulatory Visit: Payer: Self-pay

## 2022-05-16 ENCOUNTER — Telehealth: Payer: Self-pay

## 2022-05-16 ENCOUNTER — Encounter (HOSPITAL_COMMUNITY): Payer: Self-pay | Admitting: Gastroenterology

## 2022-05-16 DIAGNOSIS — I10 Essential (primary) hypertension: Secondary | ICD-10-CM | POA: Insufficient documentation

## 2022-05-16 DIAGNOSIS — K3189 Other diseases of stomach and duodenum: Secondary | ICD-10-CM

## 2022-05-16 DIAGNOSIS — Z87891 Personal history of nicotine dependence: Secondary | ICD-10-CM | POA: Insufficient documentation

## 2022-05-16 DIAGNOSIS — K838 Other specified diseases of biliary tract: Secondary | ICD-10-CM

## 2022-05-16 DIAGNOSIS — K8050 Calculus of bile duct without cholangitis or cholecystitis without obstruction: Secondary | ICD-10-CM | POA: Diagnosis not present

## 2022-05-16 DIAGNOSIS — F419 Anxiety disorder, unspecified: Secondary | ICD-10-CM | POA: Insufficient documentation

## 2022-05-16 DIAGNOSIS — Z79899 Other long term (current) drug therapy: Secondary | ICD-10-CM | POA: Diagnosis not present

## 2022-05-16 DIAGNOSIS — K8070 Calculus of gallbladder and bile duct without cholecystitis without obstruction: Secondary | ICD-10-CM | POA: Insufficient documentation

## 2022-05-16 DIAGNOSIS — K219 Gastro-esophageal reflux disease without esophagitis: Secondary | ICD-10-CM | POA: Insufficient documentation

## 2022-05-16 HISTORY — DX: Presence of spectacles and contact lenses: Z97.3

## 2022-05-16 HISTORY — PX: SPHINCTEROTOMY: SHX5544

## 2022-05-16 HISTORY — PX: ENDOSCOPIC RETROGRADE CHOLANGIOPANCREATOGRAPHY (ERCP) WITH PROPOFOL: SHX5810

## 2022-05-16 HISTORY — PX: BIOPSY: SHX5522

## 2022-05-16 HISTORY — PX: REMOVAL OF STONES: SHX5545

## 2022-05-16 SURGERY — ENDOSCOPIC RETROGRADE CHOLANGIOPANCREATOGRAPHY (ERCP) WITH PROPOFOL
Anesthesia: General

## 2022-05-16 MED ORDER — LACTATED RINGERS IV SOLN: INTRAVENOUS | Status: DC

## 2022-05-16 MED ORDER — CIPROFLOXACIN IN D5W 400 MG/200ML IV SOLN
INTRAVENOUS | Status: AC
  Filled 2022-05-16: qty 200

## 2022-05-16 MED ORDER — ROCURONIUM BROMIDE 100 MG/10ML IV SOLN
INTRAVENOUS | Status: DC | PRN
  Administered 2022-05-16: 60 mg via INTRAVENOUS

## 2022-05-16 MED ORDER — GLUCAGON HCL RDNA (DIAGNOSTIC) 1 MG IJ SOLR
INTRAMUSCULAR | Status: DC | PRN
  Administered 2022-05-16 (×2): .25 mg via INTRAVENOUS

## 2022-05-16 MED ORDER — FENTANYL CITRATE (PF) 100 MCG/2ML IJ SOLN
INTRAMUSCULAR | Status: AC
  Filled 2022-05-16: qty 2

## 2022-05-16 MED ORDER — MIDAZOLAM HCL 5 MG/5ML IJ SOLN
INTRAMUSCULAR | Status: DC | PRN
  Administered 2022-05-16: 1 mg via INTRAVENOUS

## 2022-05-16 MED ORDER — MIDAZOLAM HCL 2 MG/2ML IJ SOLN
INTRAMUSCULAR | Status: AC
  Filled 2022-05-16: qty 2

## 2022-05-16 MED ORDER — INDOMETHACIN 50 MG RE SUPP
RECTAL | Status: AC
  Filled 2022-05-16: qty 2

## 2022-05-16 MED ORDER — ONDANSETRON HCL 4 MG/2ML IJ SOLN
INTRAMUSCULAR | Status: DC | PRN
  Administered 2022-05-16: 4 mg via INTRAVENOUS

## 2022-05-16 MED ORDER — INDOMETHACIN 50 MG RE SUPP
RECTAL | Status: DC | PRN
  Administered 2022-05-16: 100 mg via RECTAL

## 2022-05-16 MED ORDER — CIPROFLOXACIN IN D5W 400 MG/200ML IV SOLN
INTRAVENOUS | Status: DC | PRN
  Administered 2022-05-16: 400 mg via INTRAVENOUS

## 2022-05-16 MED ORDER — SODIUM CHLORIDE 0.9 % IV SOLN: INTRAVENOUS | Status: DC

## 2022-05-16 MED ORDER — GLUCAGON HCL RDNA (DIAGNOSTIC) 1 MG IJ SOLR
INTRAMUSCULAR | Status: AC
  Filled 2022-05-16: qty 1

## 2022-05-16 MED ORDER — DEXAMETHASONE SODIUM PHOSPHATE 10 MG/ML IJ SOLN
INTRAMUSCULAR | Status: DC | PRN
  Administered 2022-05-16: 4 mg via INTRAVENOUS

## 2022-05-16 MED ORDER — PROPOFOL 10 MG/ML IV BOLUS
INTRAVENOUS | Status: DC | PRN
  Administered 2022-05-16: 140 mg via INTRAVENOUS

## 2022-05-16 MED ORDER — LIDOCAINE HCL (CARDIAC) PF 100 MG/5ML IV SOSY
PREFILLED_SYRINGE | INTRAVENOUS | Status: DC | PRN
  Administered 2022-05-16: 60 mg via INTRAVENOUS

## 2022-05-16 MED ORDER — SUGAMMADEX SODIUM 500 MG/5ML IV SOLN
INTRAVENOUS | Status: DC | PRN
  Administered 2022-05-16: 300 mg via INTRAVENOUS

## 2022-05-16 MED ORDER — FENTANYL CITRATE (PF) 100 MCG/2ML IJ SOLN
INTRAMUSCULAR | Status: DC | PRN
Start: 2022-05-16 — End: 2022-05-16
  Administered 2022-05-16: 100 ug via INTRAVENOUS

## 2022-05-16 MED ORDER — PHENYLEPHRINE HCL (PRESSORS) 10 MG/ML IV SOLN
INTRAVENOUS | Status: DC | PRN
  Administered 2022-05-16 (×4): 160 ug via INTRAVENOUS

## 2022-05-16 MED ORDER — SODIUM CHLORIDE 0.9 % IV SOLN
INTRAVENOUS | Status: DC | PRN
  Administered 2022-05-16: 50 mL

## 2022-05-16 NOTE — Transfer of Care (Signed)
Immediate Anesthesia Transfer of Care Note  Patient: Todd Hodge  Procedure(s) Performed: ENDOSCOPIC RETROGRADE CHOLANGIOPANCREATOGRAPHY (ERCP) WITH PROPOFOL SPHINCTEROTOMY REMOVAL OF STONES BIOPSY  Patient Location: PACU  Anesthesia Type:General  Level of Consciousness: awake, alert , oriented and patient cooperative  Airway & Oxygen Therapy: Patient Spontanous Breathing and Patient connected to face mask oxygen  Post-op Assessment: Report given to RN, Post -op Vital signs reviewed and stable and Patient moving all extremities X 4  Post vital signs: Reviewed and stable  Last Vitals:  Vitals Value Taken Time  BP 139/60 05/16/22 1211  Temp    Pulse 57 05/16/22 1214  Resp 10 05/16/22 1214  SpO2 100 % 05/16/22 1214  Vitals shown include unvalidated device data.  Last Pain:  Vitals:   05/16/22 1210  TempSrc:   PainSc: 0-No pain         Complications: No notable events documented.

## 2022-05-16 NOTE — Anesthesia Procedure Notes (Signed)
Procedure Name: Intubation Date/Time: 05/16/2022 11:28 AM  Performed by: Jonna Munro, CRNAPre-anesthesia Checklist: Patient identified, Emergency Drugs available, Suction available, Patient being monitored and Timeout performed Patient Re-evaluated:Patient Re-evaluated prior to induction Oxygen Delivery Method: Circle system utilized Preoxygenation: Pre-oxygenation with 100% oxygen Induction Type: IV induction Ventilation: Mask ventilation without difficulty Laryngoscope Size: Mac and 3 Grade View: Grade I Tube type: Oral Tube size: 7.5 mm Number of attempts: 1 Airway Equipment and Method: Stylet Placement Confirmation: ETT inserted through vocal cords under direct vision, positive ETCO2 and breath sounds checked- equal and bilateral Secured at: 22 cm Tube secured with: Tape Dental Injury: Teeth and Oropharynx as per pre-operative assessment

## 2022-05-16 NOTE — H&P (Signed)
GASTROENTEROLOGY PROCEDURE H&P NOTE   Primary Care Physician: Glenda Chroman, MD  HPI: Todd Hodge is a 71 y.o. male who presents for ERCP for attempt at choledocholithiasis removal.  Past Medical History:  Diagnosis Date   Anxiety disorder    Avascular necrosis of femur head, left ([place])    Essential hypertension, benign    GERD (gastroesophageal reflux disease)    History of cardiac catheterization    [place] 2000 - no reported significant CAD   Mitral regurgitation    Mild, echo, 02/2012   Mixed hyperlipidemia    Palpitations    Seasonal allergies    Tobacco abuse    Wears glasses    Past Surgical History:  Procedure Laterality Date   BACK SURGERY     May 2012   BIOPSY  02/23/2021   Procedure: BIOPSY;  Surgeon: Eloise Harman, DO;  [facility];  Service: Endoscopy;;   COLONOSCOPY  09/2016   Dr. Ladona Horns in [place]: three polyps, (2 were tubular adenomas and 1 sessile serrated adenoma).    COLONOSCOPY WITH PROPOFOL N/A 02/23/2021   Procedure: COLONOSCOPY WITH PROPOFOL;  Surgeon: Eloise Harman, DO;  [facility];  Service: Endoscopy;  Laterality: N/A;  pm   ESOPHAGOGASTRODUODENOSCOPY (EGD) WITH PROPOFOL N/A 02/23/2021   Procedure: ESOPHAGOGASTRODUODENOSCOPY (EGD) WITH PROPOFOL;  Surgeon: Eloise Harman, DO;  [facility];  Service: Endoscopy;  Laterality: N/A;   IR KYPHO LUMBAR INC FX REDUCE BONE BX UNI/BIL CANNULATION INC/IMAGING  05/24/2018   NOSE SURGERY     1988   Current Facility-Administered Medications  Medication Dose Route Frequency Provider Last Rate Last Admin   0.9 %  sodium chloride infusion   Intravenous Continuous Mansouraty, Telford Nab., MD       lactated ringers infusion   Intravenous Continuous Mansouraty, Telford Nab., MD        Current Facility-Administered Medications:    0.9 %  sodium chloride infusion, , Intravenous, Continuous, Mansouraty, Telford Nab., MD   lactated ringers infusion, , Intravenous,  Continuous, Mansouraty, Telford Nab., MD Allergies  Allergen Reactions   Penicillins Other (See Comments)    Happened in [place], high temperature - Can take Amoxicillin Has patient had a PCN reaction causing immediate rash, facial/tongue/throat swelling, SOB or lightheadedness with hypotension: Unknown Has patient had a PCN reaction causing severe rash involving mucus membranes or skin necrosis: Unknown Has patient had a PCN reaction that required hospitalization: Unknown Has patient had a PCN reaction occurring within the last 10 years: No If all of the above answers are "NO", then may proceed with Cep   Doxycycline Rash   Family History  Problem Relation Age of Onset   Heart failure Mother    Colon cancer Neg Hx    Social History   Socioeconomic History   Marital status: Married    Spouse name: Not on file   Number of children: Not on file   Years of education: Not on file   Highest education level: Not on file  Occupational History   Occupation: Disabled    Comment: Sales  Tobacco Use   Smoking status: Former    Packs/day: 1.00    Years: 25.00    Total pack years: 25.00    Types: Cigarettes   Smokeless tobacco: Never   Tobacco comments:    can't remember how long ago he smoked  Vaping Use   Vaping Use: Never used  Substance and Sexual Activity   Alcohol  use: No    Comment: occ   Drug use: No   Sexual activity: Not on file  Other Topics Concern   Not on file  Social History Narrative   Not on file   Social Determinants of Health   Financial Resource Strain: Not on file  Food Insecurity: Not on file  Transportation Needs: Not on file  Physical Activity: Not on file  Stress: Not on file  Social Connections: Not on file  Intimate Partner Violence: Not on file    Physical Exam: Today's Vitals   05/13/22 1423 05/16/22 1054  BP:  (!) 174/69  Pulse:  60  Resp:  10  Temp:  97.7 F (36.5 C)  TempSrc:  Oral  SpO2:  100%  Weight: 68 kg 70.3 kg  Height:  '5\' 9"'$  (1.753 m) '5\' 9"'$  (1.753 m)  PainSc:  6    Body mass index is 22.89 kg/m. GEN: NAD EYE: Sclerae anicteric ENT: MMM CV: Non-tachycardic GI: Soft, NT/ND NEURO:  Alert & Oriented x 3  Lab Results: No results for input(s): "WBC", "HGB", "HCT", "PLT" in the last 72 hours. BMET No results for input(s): "NA", "K", "CL", "CO2", "GLUCOSE", "BUN", "CREATININE", "CALCIUM" in the last 72 hours. LFT No results for input(s): "PROT", "ALBUMIN", "AST", "ALT", "ALKPHOS", "BILITOT", "BILIDIR", "IBILI" in the last 72 hours. PT/INR No results for input(s): "LABPROT", "INR" in the last 72 hours.   Impression / Plan: This is a 71 y.o.male who presents for ERCP for attempt at choledocholithiasis removal.  The risks of an ERCP were discussed at length, including but not limited to the risk of perforation, bleeding, abdominal pain, post-ERCP pancreatitis (while usually mild can be severe and even life threatening).   The risks and benefits of endoscopic evaluation/treatment were discussed with the patient and/or family; these include but are not limited to the risk of perforation, infection, bleeding, missed lesions, lack of diagnosis, severe illness requiring hospitalization, as well as anesthesia and sedation related illnesses.  The patient's history has been reviewed, patient examined, no change in status, and deemed stable for procedure.  The patient and/or family is agreeable to proceed.    Justice Britain, MD [facility] Office # [phone]

## 2022-05-16 NOTE — Anesthesia Postprocedure Evaluation (Signed)
Anesthesia Post Note  Patient: Todd Hodge  Procedure(s) Performed: ENDOSCOPIC RETROGRADE CHOLANGIOPANCREATOGRAPHY (ERCP) WITH PROPOFOL SPHINCTEROTOMY REMOVAL OF STONES BIOPSY     Patient location during evaluation: Endoscopy Anesthesia Type: General Level of consciousness: awake and alert Pain management: pain level controlled Vital Signs Assessment: post-procedure vital signs reviewed and stable Respiratory status: spontaneous breathing, nonlabored ventilation, respiratory function stable and patient connected to nasal cannula oxygen Cardiovascular status: blood pressure returned to baseline and stable Postop Assessment: no apparent nausea or vomiting Anesthetic complications: no   No notable events documented.  Last Vitals:  Vitals:   05/16/22 1210 05/16/22 1220  BP: 139/60 (!) 153/81  Pulse:  (!) 55  Resp: 15 16  Temp: 36.5 C   SpO2: 100% 100%    Last Pain:  Vitals:   05/16/22 1220  TempSrc:   PainSc: 0-No pain                 Belenda Cruise P Cartrell Bentsen

## 2022-05-16 NOTE — Telephone Encounter (Signed)
-----   Message from Irving Copas., MD sent at 05/16/2022  2:17 PM EDT ----- Lupita Leash, Thanks for the referral of this patient.  ERCP completed.  Stone removed.  He will benefit from a cholecystectomy.  He would like to have the procedure done in [place] so I am going to go ahead and get a referral placed.  Camelia Stelzner, Please place a referral for cholecystectomy discussion with [facility] for history of choledocholithiasis and consideration of resection.  [facility] preference but otherwise can be seen by any of the other providers. Thanks. GM

## 2022-05-16 NOTE — Discharge Instructions (Signed)
YOU HAD AN ENDOSCOPIC PROCEDURE TODAY: Refer to the procedure report and other information in the discharge instructions given to you for any specific questions about what was found during the examination. If this information does not answer your questions, please call [place] office at [phone] to clarify.   YOU SHOULD EXPECT: Some feelings of bloating in the abdomen. Passage of more gas than usual. Walking can help get rid of the air that was put into your GI tract during the procedure and reduce the bloating. If you had a lower endoscopy (such as a colonoscopy or flexible sigmoidoscopy) you may notice spotting of blood in your stool or on the toilet paper. Some abdominal soreness may be present for a day or two, also.  DIET: Your first meal following the procedure should be a light meal and then it is ok to progress to your normal diet. A half-sandwich or bowl of soup is an example of a good first meal. Heavy or fried foods are harder to digest and may make you feel nauseous or bloated. Drink plenty of fluids but you should avoid alcoholic beverages for 24 hours. If you had a esophageal dilation, please see attached instructions for diet.    ACTIVITY: Your care partner should take you home directly after the procedure. You should plan to take it easy, moving slowly for the rest of the day. You can resume normal activity the day after the procedure however YOU SHOULD NOT DRIVE, use power tools, machinery or perform tasks that involve climbing or major physical exertion for 24 hours (because of the sedation medicines used during the test).   SYMPTOMS TO REPORT IMMEDIATELY: A gastroenterologist can be reached at any hour. Please call [phone]  for any of the following symptoms:   Following upper endoscopy (EGD, EUS, ERCP, esophageal dilation) Vomiting of blood or coffee ground material  New, significant abdominal pain  New, significant chest pain or pain under the shoulder blades  Painful or  persistently difficult swallowing  New shortness of breath  Black, tarry-looking or red, bloody stools  FOLLOW UP:  If any biopsies were taken you will be contacted by phone or by letter within the next 1-3 weeks. Call [phone]  if you have not heard about the biopsies in 3 weeks.  Please also call with any specific questions about appointments or follow up tests.  

## 2022-05-16 NOTE — Anesthesia Preprocedure Evaluation (Signed)
Anesthesia Evaluation  Patient identified by MRN, date of birth, ID band Patient awake    Reviewed: Allergy & Precautions, NPO status , Patient's Chart, lab work & pertinent test results  Airway Mallampati: II  TM Distance: >3 FB Neck ROM: Full    Dental  (+) Edentulous Upper, Edentulous Lower   Pulmonary neg pulmonary ROS, former smoker,    Pulmonary exam normal        Cardiovascular hypertension, Pt. on medications and Pt. on home beta blockers  Rhythm:Regular Rate:Normal     Neuro/Psych Anxiety negative neurological ROS     GI/Hepatic Neg liver ROS, GERD  Medicated,Biliary dilation   Endo/Other  negative endocrine ROS  Renal/GU negative Renal ROS  negative genitourinary   Musculoskeletal negative musculoskeletal ROS (+)   Abdominal Normal abdominal exam  (+)   Peds  Hematology negative hematology ROS (+)   Anesthesia Other Findings   Reproductive/Obstetrics                             Anesthesia Physical Anesthesia Plan  ASA: 2  Anesthesia Plan: General   Post-op Pain Management:    Induction: Intravenous  PONV Risk Score and Plan: 2 and Ondansetron, Dexamethasone and Treatment may vary due to age or medical condition  Airway Management Planned: Mask and Oral ETT  Additional Equipment: None  Intra-op Plan:   Post-operative Plan: Extubation in OR  Informed Consent: I have reviewed the patients History and Physical, chart, labs and discussed the procedure including the risks, benefits and alternatives for the proposed anesthesia with the patient or authorized representative who has indicated his/her understanding and acceptance.     Dental advisory given  Plan Discussed with: CRNA  Anesthesia Plan Comments:         Anesthesia Quick Evaluation

## 2022-05-16 NOTE — Telephone Encounter (Signed)
The referral has been made to [place] [facility] records faxed

## 2022-05-16 NOTE — Op Note (Signed)
[facility] Patient Name: Todd Hodge Procedure Date: 05/16/2022 MRN: [id number] Attending MD: Justice Britain , MD Date of Birth: 12-May-1951 CSN: [id number] Age: 71 Admit Type: Outpatient Procedure:                ERCP Indications:              Bile duct stone(s), Common bile duct stone(s),                            Abnormal MRCP Providers:                Justice Britain, MD, Carlyn Reichert, RN, William Dalton, Technician, Frazier Richards, Technician Referring MD:              Medicines:                Monitored Anesthesia Care Complications:            No immediate complications. Estimated Blood Loss:     Estimated blood loss was minimal. Procedure:                Pre-Anesthesia Assessment:                           - Prior to the procedure, a History and Physical                            was performed, and patient medications and                            allergies were reviewed. The patient's tolerance of                            previous anesthesia was also reviewed. The risks                            and benefits of the procedure and the sedation                            options and risks were discussed with the patient.                            All questions were answered, and informed consent                            was obtained. Prior Anticoagulants: The patient has                            taken no previous anticoagulant or antiplatelet                            agents except for aspirin. ASA Grade Assessment:                            III -  A patient with severe systemic disease. After                            reviewing the risks and benefits, the patient was                            deemed in satisfactory condition to undergo the                            procedure.                           After obtaining informed consent, the scope was                            passed under direct vision. Throughout  the                            procedure, the patient's blood pressure, pulse, and                            oxygen saturations were monitored continuously. The                            [id number]-Q190V ([id number]) Olympus duodenoscope was                            introduced through the mouth, and used to inject                            contrast into and used to inject contrast into the                            bile duct. The ERCP was accomplished without                            difficulty. The patient tolerated the procedure. Scope In: Scope Out: Findings:      The scout film was normal.      The scope was passed under direct vision through the upper GI tract.       Patchy moderately erythematous mucosa without bleeding was found in the       entire examined stomach. No gross lesions were noted in the duodenal       bulb, in the first portion of the duodenum and in the second portion of       the duodenum. Biopsies were taken in the cardia, on the greater       curvature of the stomach, on the lesser curvature of the stomach, at the       incisura and in the gastric antrum through the ERCP scope with the cold       forceps for histology and HP assessment. The major papilla was under a       hood but otherwise normal.      A short 0.035 inch Soft Jagwire was passed into the biliary tree after  slightly engaging the papilla and bowing the sphincterotome. The       Hydratome sphincterotome was passed over the guidewire and the bile duct       was then deeply cannulated. Contrast was injected. I personally       interpreted the bile duct images. Ductal flow of contrast was adequate.       Image quality was adequate. Contrast extended to the hepatic ducts.       Opacification of the entire opacified area was successful. The lower       third of the main bile duct contained filling defect thought to be a       stone and sludge. The main bile duct was moderately dilated. The largest        diameter was 10 mm. A 10 mm biliary sphincterotomy was made with a       monofilament Hydratome sphincterotome using ERBE electrocautery. There       was no post-sphincterotomy bleeding. The biliary tree was swept with a       retrieval balloon in both the left and right hepatic ducts. Sludge was       swept from the duct. One stone was removed. No stones remained. An       occlusion cholangiogram was performed that showed no further significant       biliary pathology.      A pancreatogram was not performed.      The duodenoscope was withdrawn from the patient. Impression:               - Erythematous mucosa in the stomach - biopsied for                            HP evaluation.                           - No gross lesions in the duodenal bulb, in the                            first portion of the duodenum and in the second                            portion of the duodenum.                           - The major papilla appeared normal.                           - A filling defect consistent with a stone and                            sludge was seen on the cholangiogram.                           - The entire main bile duct was moderately dilated.                           - Choledocholithiasis was found. Complete removal  was accomplished by biliary sphincterotomy and                            balloon trawl. Moderate Sedation:      Not Applicable - Patient had care per Anesthesia. Recommendation:           - The patient will be observed post-procedure,                            until all discharge criteria are met.                           - Discharge patient to home.                           - Patient has a contact number available for                            emergencies. The signs and symptoms of potential                            delayed complications were discussed with the                            patient. Return to normal activities  tomorrow.                            Written discharge instructions were provided to the                            patient.                           - Observe patient's clinical course.                           - Watch for pancreatitis, bleeding, perforation,                            and cholangitis.                           - Check liver enzymes (AST, ALT, alkaline                            phosphatase, bilirubin) in 2 weeks.                           - Refer to a surgeon to discuss cholecystectomy                            candidacy.                           - The findings and recommendations were discussed  with the patient.                           - The findings and recommendations were discussed                            with the patient's family. Procedure Code(s):        --- Professional ---                           [phone], Endoscopic retrograde                            cholangiopancreatography (ERCP); with removal of                            calculi/debris from biliary/pancreatic duct(s)                           43262, Endoscopic retrograde                            cholangiopancreatography (ERCP); with                            sphincterotomy/papillotomy                           43261, Endoscopic retrograde                            cholangiopancreatography (ERCP); with biopsy,                            single or multiple Diagnosis Code(s):        --- Professional ---                           K31.89, Other diseases of stomach and duodenum                           R93.2, Abnormal findings on diagnostic imaging of                            liver and biliary tract                           K80.50, Calculus of bile duct without cholangitis                            or cholecystitis without obstruction                           K83.8, Other specified diseases of biliary tract CPT copyright 2019 [employer]. All  rights reserved. The codes documented in this report are preliminary and upon coder review may  be revised to meet current compliance requirements. Justice Britain, MD 05/16/2022 12:11:10 PM Number of Addenda: 0

## 2022-05-17 ENCOUNTER — Encounter (HOSPITAL_COMMUNITY): Payer: Self-pay | Admitting: Gastroenterology

## 2022-05-17 LAB — SURGICAL PATHOLOGY

## 2022-05-18 ENCOUNTER — Encounter: Payer: Self-pay | Admitting: Gastroenterology

## 2022-06-07 DIAGNOSIS — F411 Generalized anxiety disorder: Secondary | ICD-10-CM | POA: Diagnosis not present

## 2022-06-07 DIAGNOSIS — R5383 Other fatigue: Secondary | ICD-10-CM | POA: Diagnosis not present

## 2022-06-07 DIAGNOSIS — M545 Low back pain, unspecified: Secondary | ICD-10-CM | POA: Diagnosis not present

## 2022-06-07 DIAGNOSIS — I10 Essential (primary) hypertension: Secondary | ICD-10-CM | POA: Diagnosis not present

## 2022-06-07 DIAGNOSIS — Z299 Encounter for prophylactic measures, unspecified: Secondary | ICD-10-CM | POA: Diagnosis not present

## 2022-06-07 DIAGNOSIS — Z79899 Other long term (current) drug therapy: Secondary | ICD-10-CM | POA: Diagnosis not present

## 2022-07-07 DIAGNOSIS — J449 Chronic obstructive pulmonary disease, unspecified: Secondary | ICD-10-CM | POA: Diagnosis not present

## 2022-07-07 DIAGNOSIS — I700 Atherosclerosis of aorta: Secondary | ICD-10-CM | POA: Diagnosis not present

## 2022-07-07 DIAGNOSIS — I10 Essential (primary) hypertension: Secondary | ICD-10-CM | POA: Diagnosis not present

## 2022-07-07 DIAGNOSIS — Z79899 Other long term (current) drug therapy: Secondary | ICD-10-CM | POA: Diagnosis not present

## 2022-07-07 DIAGNOSIS — M545 Low back pain, unspecified: Secondary | ICD-10-CM | POA: Diagnosis not present

## 2022-07-07 DIAGNOSIS — Z299 Encounter for prophylactic measures, unspecified: Secondary | ICD-10-CM | POA: Diagnosis not present

## 2022-07-10 DIAGNOSIS — Z79899 Other long term (current) drug therapy: Secondary | ICD-10-CM | POA: Diagnosis not present

## 2022-07-10 DIAGNOSIS — I10 Essential (primary) hypertension: Secondary | ICD-10-CM | POA: Diagnosis not present

## 2022-07-10 DIAGNOSIS — E871 Hypo-osmolality and hyponatremia: Secondary | ICD-10-CM | POA: Diagnosis not present

## 2022-07-10 DIAGNOSIS — R109 Unspecified abdominal pain: Secondary | ICD-10-CM | POA: Diagnosis not present

## 2022-07-10 DIAGNOSIS — Z792 Long term (current) use of antibiotics: Secondary | ICD-10-CM | POA: Diagnosis not present

## 2022-07-10 DIAGNOSIS — J449 Chronic obstructive pulmonary disease, unspecified: Secondary | ICD-10-CM | POA: Diagnosis not present

## 2022-07-10 DIAGNOSIS — I700 Atherosclerosis of aorta: Secondary | ICD-10-CM | POA: Diagnosis not present

## 2022-07-10 DIAGNOSIS — K449 Diaphragmatic hernia without obstruction or gangrene: Secondary | ICD-10-CM | POA: Diagnosis not present

## 2022-07-10 DIAGNOSIS — K219 Gastro-esophageal reflux disease without esophagitis: Secondary | ICD-10-CM | POA: Diagnosis not present

## 2022-07-10 DIAGNOSIS — E782 Mixed hyperlipidemia: Secondary | ICD-10-CM | POA: Diagnosis not present

## 2022-07-10 DIAGNOSIS — N182 Chronic kidney disease, stage 2 (mild): Secondary | ICD-10-CM | POA: Diagnosis not present

## 2022-07-10 DIAGNOSIS — K810 Acute cholecystitis: Secondary | ICD-10-CM | POA: Diagnosis not present

## 2022-07-10 DIAGNOSIS — I129 Hypertensive chronic kidney disease with stage 1 through stage 4 chronic kidney disease, or unspecified chronic kidney disease: Secondary | ICD-10-CM | POA: Diagnosis not present

## 2022-07-10 DIAGNOSIS — E876 Hypokalemia: Secondary | ICD-10-CM | POA: Diagnosis not present

## 2022-07-10 DIAGNOSIS — E785 Hyperlipidemia, unspecified: Secondary | ICD-10-CM | POA: Diagnosis not present

## 2022-07-10 DIAGNOSIS — K5730 Diverticulosis of large intestine without perforation or abscess without bleeding: Secondary | ICD-10-CM | POA: Diagnosis not present

## 2022-07-11 DIAGNOSIS — J449 Chronic obstructive pulmonary disease, unspecified: Secondary | ICD-10-CM | POA: Diagnosis not present

## 2022-07-11 DIAGNOSIS — I129 Hypertensive chronic kidney disease with stage 1 through stage 4 chronic kidney disease, or unspecified chronic kidney disease: Secondary | ICD-10-CM | POA: Diagnosis not present

## 2022-07-11 DIAGNOSIS — N400 Enlarged prostate without lower urinary tract symptoms: Secondary | ICD-10-CM | POA: Diagnosis not present

## 2022-07-11 DIAGNOSIS — E782 Mixed hyperlipidemia: Secondary | ICD-10-CM | POA: Diagnosis not present

## 2022-07-11 DIAGNOSIS — K828 Other specified diseases of gallbladder: Secondary | ICD-10-CM | POA: Diagnosis not present

## 2022-07-11 DIAGNOSIS — E785 Hyperlipidemia, unspecified: Secondary | ICD-10-CM | POA: Diagnosis not present

## 2022-07-11 DIAGNOSIS — Z792 Long term (current) use of antibiotics: Secondary | ICD-10-CM | POA: Diagnosis not present

## 2022-07-11 DIAGNOSIS — K812 Acute cholecystitis with chronic cholecystitis: Secondary | ICD-10-CM | POA: Diagnosis not present

## 2022-07-11 DIAGNOSIS — I10 Essential (primary) hypertension: Secondary | ICD-10-CM | POA: Diagnosis not present

## 2022-07-11 DIAGNOSIS — K8012 Calculus of gallbladder with acute and chronic cholecystitis without obstruction: Secondary | ICD-10-CM | POA: Diagnosis not present

## 2022-07-11 DIAGNOSIS — K810 Acute cholecystitis: Secondary | ICD-10-CM | POA: Diagnosis not present

## 2022-07-11 DIAGNOSIS — N182 Chronic kidney disease, stage 2 (mild): Secondary | ICD-10-CM | POA: Diagnosis not present

## 2022-07-11 DIAGNOSIS — K219 Gastro-esophageal reflux disease without esophagitis: Secondary | ICD-10-CM | POA: Diagnosis not present

## 2022-07-11 DIAGNOSIS — Z7982 Long term (current) use of aspirin: Secondary | ICD-10-CM | POA: Diagnosis not present

## 2022-07-11 DIAGNOSIS — Z87891 Personal history of nicotine dependence: Secondary | ICD-10-CM | POA: Diagnosis not present

## 2022-07-11 DIAGNOSIS — Z79899 Other long term (current) drug therapy: Secondary | ICD-10-CM | POA: Diagnosis not present

## 2022-07-12 DIAGNOSIS — N400 Enlarged prostate without lower urinary tract symptoms: Secondary | ICD-10-CM | POA: Diagnosis not present

## 2022-07-12 DIAGNOSIS — Z87891 Personal history of nicotine dependence: Secondary | ICD-10-CM | POA: Diagnosis not present

## 2022-07-12 DIAGNOSIS — K810 Acute cholecystitis: Secondary | ICD-10-CM | POA: Diagnosis not present

## 2022-07-12 DIAGNOSIS — I10 Essential (primary) hypertension: Secondary | ICD-10-CM | POA: Diagnosis not present

## 2022-07-12 DIAGNOSIS — Z7982 Long term (current) use of aspirin: Secondary | ICD-10-CM | POA: Diagnosis not present

## 2022-07-12 DIAGNOSIS — I129 Hypertensive chronic kidney disease with stage 1 through stage 4 chronic kidney disease, or unspecified chronic kidney disease: Secondary | ICD-10-CM | POA: Diagnosis not present

## 2022-07-12 DIAGNOSIS — K8012 Calculus of gallbladder with acute and chronic cholecystitis without obstruction: Secondary | ICD-10-CM | POA: Diagnosis not present

## 2022-07-12 DIAGNOSIS — E782 Mixed hyperlipidemia: Secondary | ICD-10-CM | POA: Diagnosis not present

## 2022-07-12 DIAGNOSIS — N182 Chronic kidney disease, stage 2 (mild): Secondary | ICD-10-CM | POA: Diagnosis not present

## 2022-07-12 DIAGNOSIS — K219 Gastro-esophageal reflux disease without esophagitis: Secondary | ICD-10-CM | POA: Diagnosis not present

## 2022-07-12 DIAGNOSIS — J449 Chronic obstructive pulmonary disease, unspecified: Secondary | ICD-10-CM | POA: Diagnosis not present

## 2022-07-12 DIAGNOSIS — Z79899 Other long term (current) drug therapy: Secondary | ICD-10-CM | POA: Diagnosis not present

## 2022-07-20 DIAGNOSIS — E7800 Pure hypercholesterolemia, unspecified: Secondary | ICD-10-CM | POA: Diagnosis not present

## 2022-07-20 DIAGNOSIS — R0789 Other chest pain: Secondary | ICD-10-CM | POA: Diagnosis not present

## 2022-07-20 DIAGNOSIS — I10 Essential (primary) hypertension: Secondary | ICD-10-CM | POA: Diagnosis not present

## 2022-07-20 DIAGNOSIS — Z299 Encounter for prophylactic measures, unspecified: Secondary | ICD-10-CM | POA: Diagnosis not present

## 2022-07-20 DIAGNOSIS — I700 Atherosclerosis of aorta: Secondary | ICD-10-CM | POA: Diagnosis not present

## 2022-07-20 DIAGNOSIS — Z09 Encounter for follow-up examination after completed treatment for conditions other than malignant neoplasm: Secondary | ICD-10-CM | POA: Diagnosis not present

## 2022-08-05 DIAGNOSIS — J449 Chronic obstructive pulmonary disease, unspecified: Secondary | ICD-10-CM | POA: Diagnosis not present

## 2022-08-05 DIAGNOSIS — M545 Low back pain, unspecified: Secondary | ICD-10-CM | POA: Diagnosis not present

## 2022-08-05 DIAGNOSIS — Z299 Encounter for prophylactic measures, unspecified: Secondary | ICD-10-CM | POA: Diagnosis not present

## 2022-08-05 DIAGNOSIS — Z79899 Other long term (current) drug therapy: Secondary | ICD-10-CM | POA: Diagnosis not present

## 2022-08-05 DIAGNOSIS — F1120 Opioid dependence, uncomplicated: Secondary | ICD-10-CM | POA: Diagnosis not present

## 2022-08-09 DIAGNOSIS — Z961 Presence of intraocular lens: Secondary | ICD-10-CM | POA: Diagnosis not present

## 2022-08-09 DIAGNOSIS — H26492 Other secondary cataract, left eye: Secondary | ICD-10-CM | POA: Diagnosis not present

## 2022-08-15 DIAGNOSIS — Z6824 Body mass index (BMI) 24.0-24.9, adult: Secondary | ICD-10-CM | POA: Diagnosis not present

## 2022-08-15 DIAGNOSIS — Z7189 Other specified counseling: Secondary | ICD-10-CM | POA: Diagnosis not present

## 2022-08-15 DIAGNOSIS — Z125 Encounter for screening for malignant neoplasm of prostate: Secondary | ICD-10-CM | POA: Diagnosis not present

## 2022-08-15 DIAGNOSIS — E7800 Pure hypercholesterolemia, unspecified: Secondary | ICD-10-CM | POA: Diagnosis not present

## 2022-08-15 DIAGNOSIS — Z299 Encounter for prophylactic measures, unspecified: Secondary | ICD-10-CM | POA: Diagnosis not present

## 2022-08-15 DIAGNOSIS — Z23 Encounter for immunization: Secondary | ICD-10-CM | POA: Diagnosis not present

## 2022-08-15 DIAGNOSIS — Z79899 Other long term (current) drug therapy: Secondary | ICD-10-CM | POA: Diagnosis not present

## 2022-08-15 DIAGNOSIS — Z1331 Encounter for screening for depression: Secondary | ICD-10-CM | POA: Diagnosis not present

## 2022-08-15 DIAGNOSIS — Z1339 Encounter for screening examination for other mental health and behavioral disorders: Secondary | ICD-10-CM | POA: Diagnosis not present

## 2022-08-15 DIAGNOSIS — R5383 Other fatigue: Secondary | ICD-10-CM | POA: Diagnosis not present

## 2022-08-15 DIAGNOSIS — I10 Essential (primary) hypertension: Secondary | ICD-10-CM | POA: Diagnosis not present

## 2022-08-15 DIAGNOSIS — Z0000 Encounter for general adult medical examination without abnormal findings: Secondary | ICD-10-CM | POA: Diagnosis not present

## 2022-09-02 DIAGNOSIS — J449 Chronic obstructive pulmonary disease, unspecified: Secondary | ICD-10-CM | POA: Diagnosis not present

## 2022-09-02 DIAGNOSIS — M545 Low back pain, unspecified: Secondary | ICD-10-CM | POA: Diagnosis not present

## 2022-09-02 DIAGNOSIS — F1120 Opioid dependence, uncomplicated: Secondary | ICD-10-CM | POA: Diagnosis not present

## 2022-09-02 DIAGNOSIS — Z299 Encounter for prophylactic measures, unspecified: Secondary | ICD-10-CM | POA: Diagnosis not present

## 2022-09-02 DIAGNOSIS — Z79899 Other long term (current) drug therapy: Secondary | ICD-10-CM | POA: Diagnosis not present

## 2022-09-07 DIAGNOSIS — H26492 Other secondary cataract, left eye: Secondary | ICD-10-CM | POA: Diagnosis not present

## 2022-09-20 DIAGNOSIS — H26492 Other secondary cataract, left eye: Secondary | ICD-10-CM | POA: Diagnosis not present

## 2022-09-24 IMAGING — MR MR 3D RECON AT [facility]
1 of 3 series · 3 of 48 positions shown · IV contrast (gadavist)
Comparison: CT on 09/17/2021

CLINICAL DATA: Mild biliary ductal dilatation on recent CT.
Elevated liver function tests.

EXAM:
MRI ABDOMEN WITHOUT AND WITH CONTRAST (INCLUDING MRCP)
TECHNIQUE: Multiplanar multisequence MR imaging of the abdomen was performed
both before and after the administration of intravenous contrast.
Heavily T2-weighted images of the biliary and pancreatic ducts were
obtained, and three-dimensional MRCP images were rendered by post
processing.
CONTRAST:  7mL GADAVIST GADOBUTROL 1 MMOL/ML IV SOLN

[Series 1003: MRCP · sagittal · 0.5mm · 0.25mm/px · 3 of 19 slices shown]
[im 4/19]
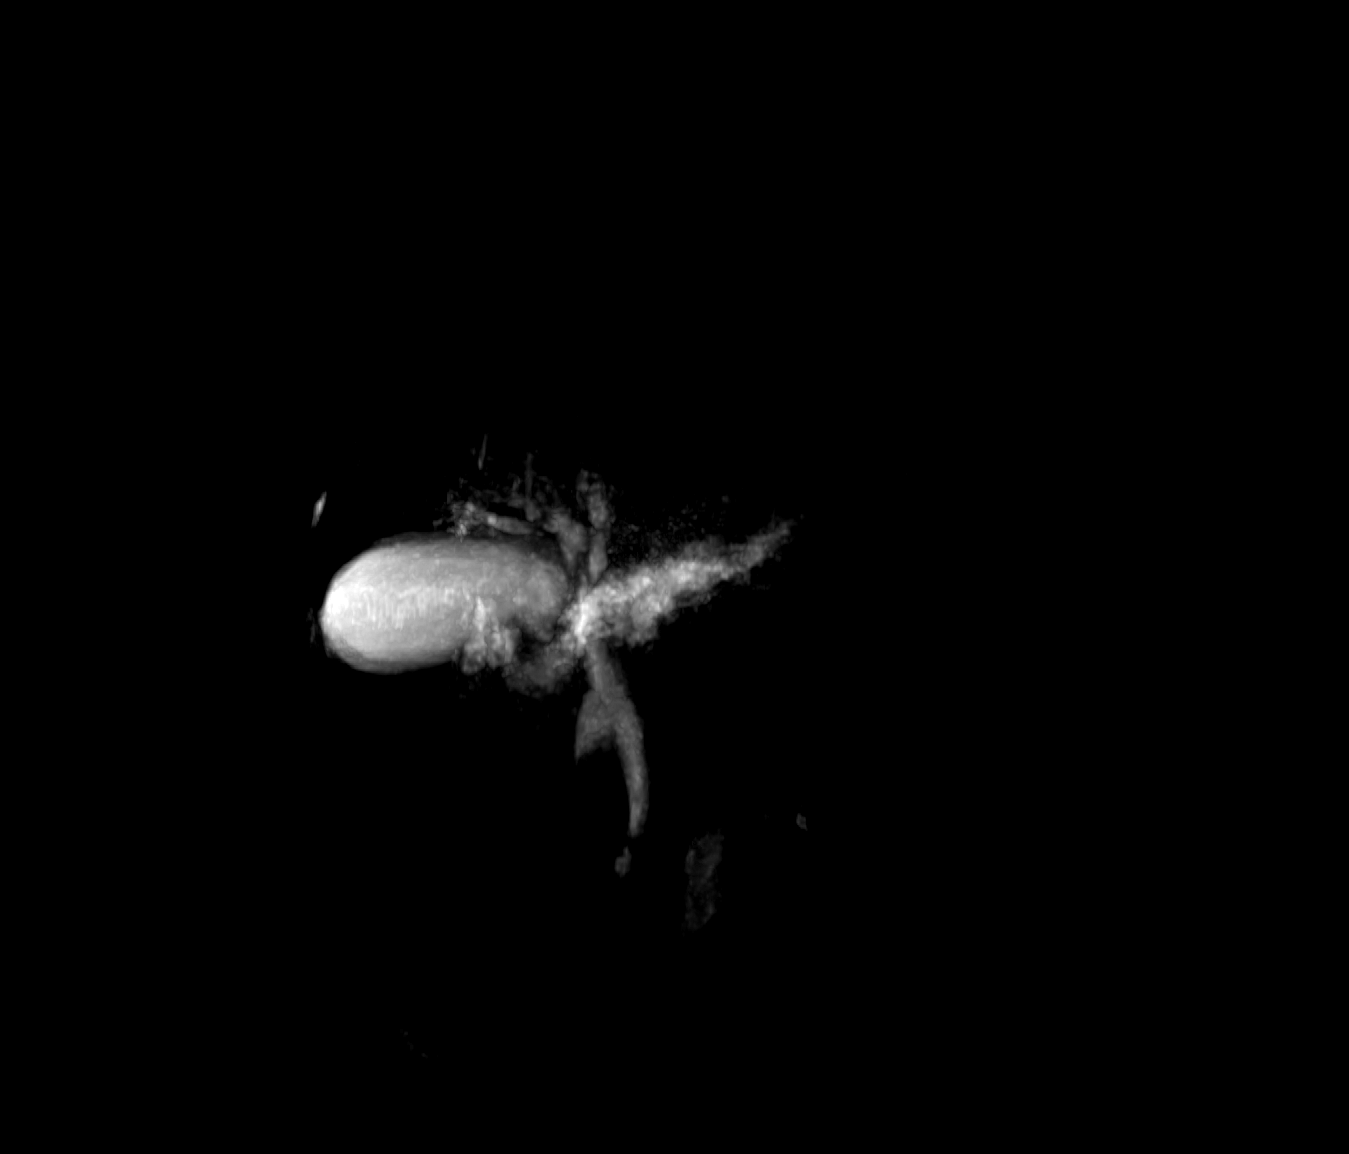
[im 10/19]
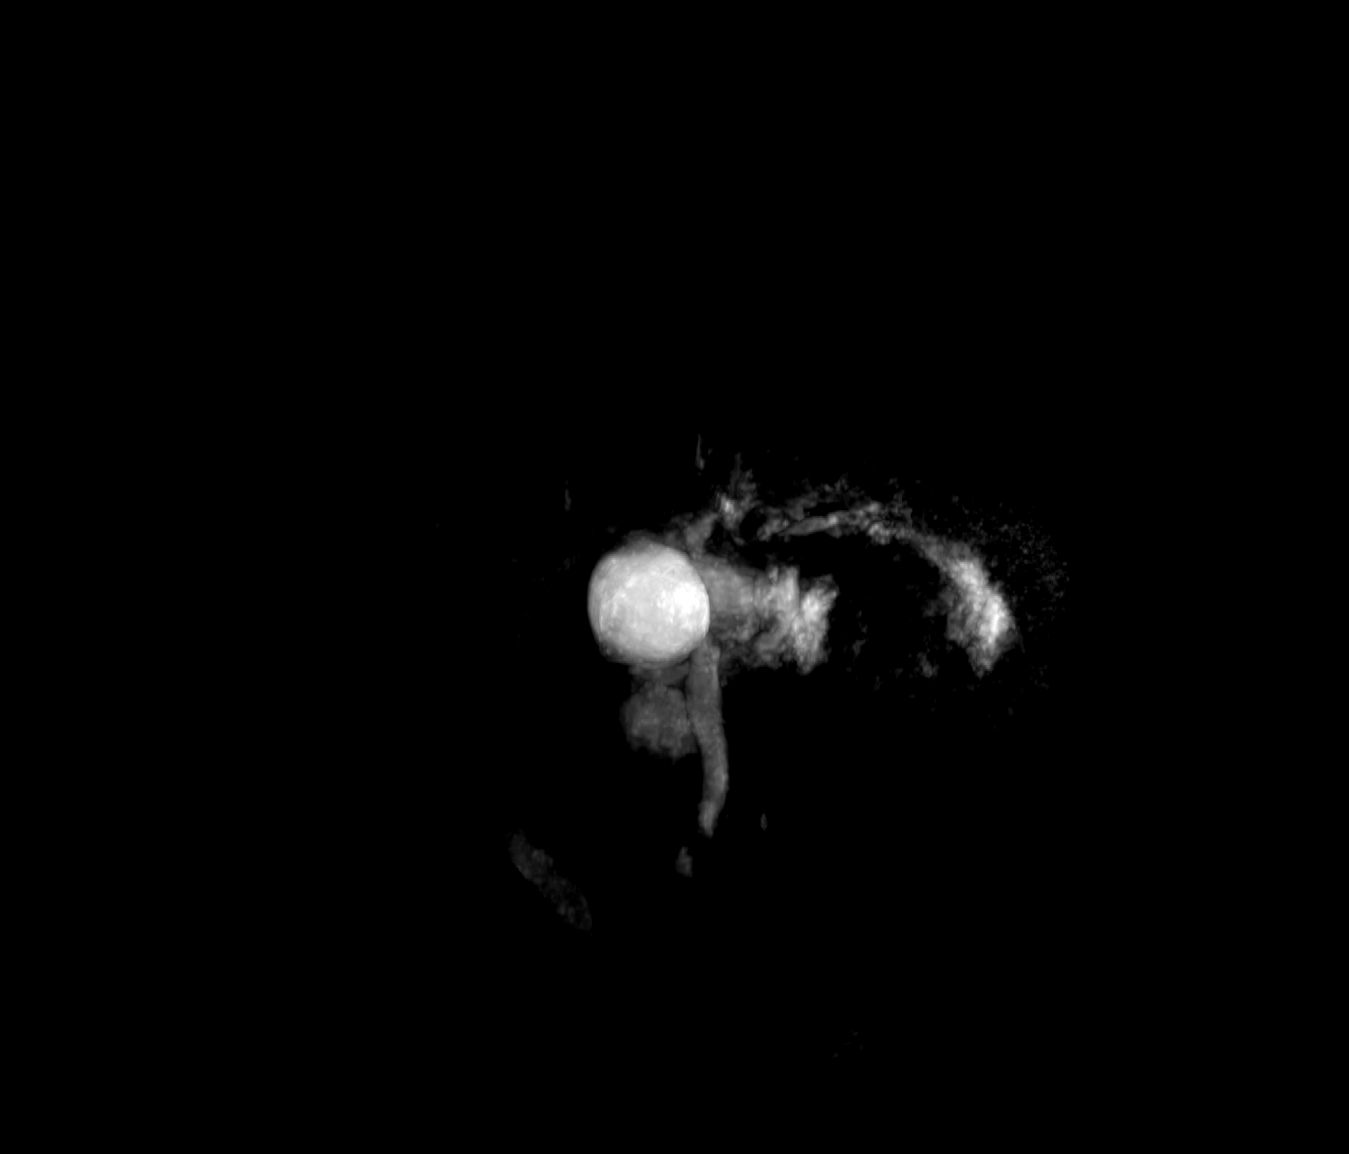
[im 16/19]
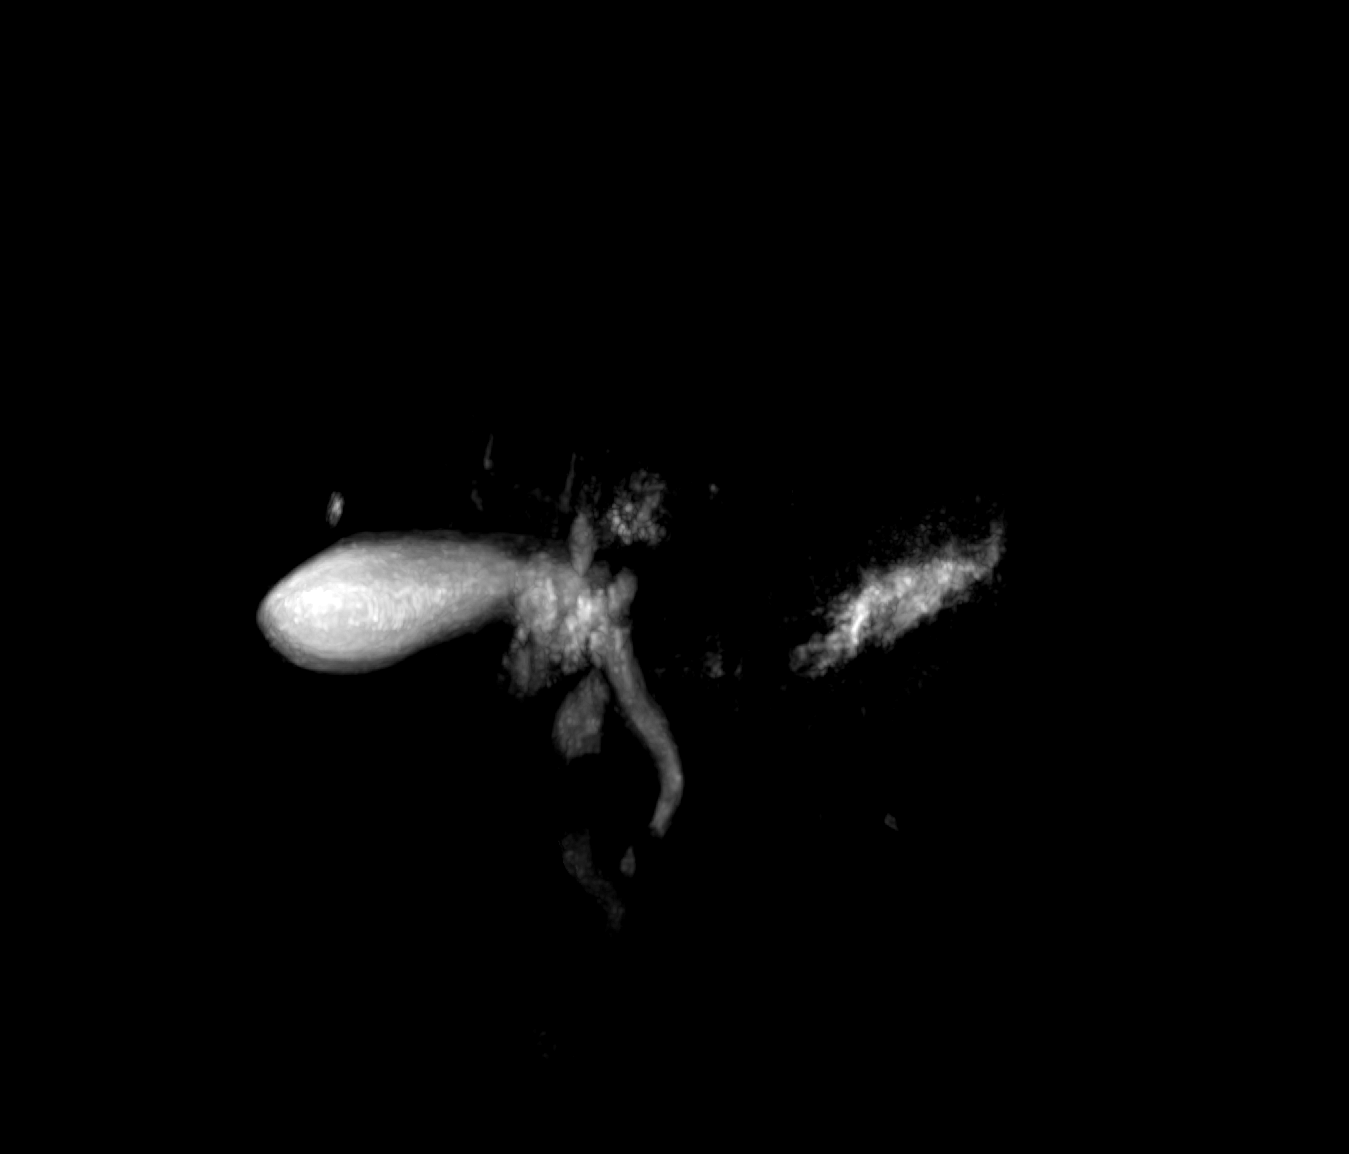

[3 of 48 positions shown; findings below may reference images not displayed]

FINDINGS: Lower chest: No acute findings.

Hepatobiliary: No hepatic masses identified. A 10 mm gallstone is
seen, however there is no evidence of cholecystitis. Mild biliary
ductal dilatation is seen with the common bile duct measuring 7-8
mm. A 7 mm filling defect in the distal common bile duct is
consistent with a common duct stone.

Pancreas: No mass or inflammatory changes. No evidence of pancreatic
ductal dilatation or pancreas divisum.

Spleen:  Within normal limits in size and appearance.

Adrenals/Urinary Tract: No masses identified. No evidence of
hydronephrosis.

Stomach/Bowel: Unremarkable.

Vascular/Lymphatic: No pathologically enlarged lymph nodes
identified. No acute vascular findings.

Other:  None.

Musculoskeletal:  No suspicious bone lesions identified.
IMPRESSION: 10 mm gallstone.  No radiographic evidence of cholecystitis.

Mild biliary ductal dilatation, with 7 mm stone in distal common
bile duct.

## 2022-10-03 DIAGNOSIS — F1120 Opioid dependence, uncomplicated: Secondary | ICD-10-CM | POA: Diagnosis not present

## 2022-10-03 DIAGNOSIS — I10 Essential (primary) hypertension: Secondary | ICD-10-CM | POA: Diagnosis not present

## 2022-10-03 DIAGNOSIS — M545 Low back pain, unspecified: Secondary | ICD-10-CM | POA: Diagnosis not present

## 2022-10-03 DIAGNOSIS — J449 Chronic obstructive pulmonary disease, unspecified: Secondary | ICD-10-CM | POA: Diagnosis not present

## 2022-10-03 DIAGNOSIS — Z79899 Other long term (current) drug therapy: Secondary | ICD-10-CM | POA: Diagnosis not present

## 2022-10-03 DIAGNOSIS — Z299 Encounter for prophylactic measures, unspecified: Secondary | ICD-10-CM | POA: Diagnosis not present

## 2022-10-12 IMAGING — RF DG ERCP WO/W SPHINCTEROTOMY
1 series · 11 of 11 positions shown · non-contrast
Comparison: MRI/MRCP 04/28/2022

CLINICAL DATA: Choledocholithiasis.

EXAM:
ERCP
TECHNIQUE: Multiple spot images obtained with the fluoroscopic device and
submitted for interpretation post-procedure.

[Series 1: run · 8 acquisitions, 11 frames shown]
[im 1/8]
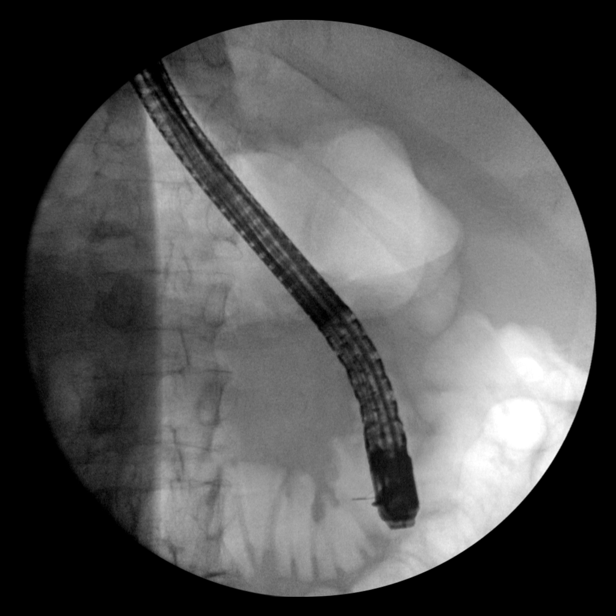
[im 2/8]
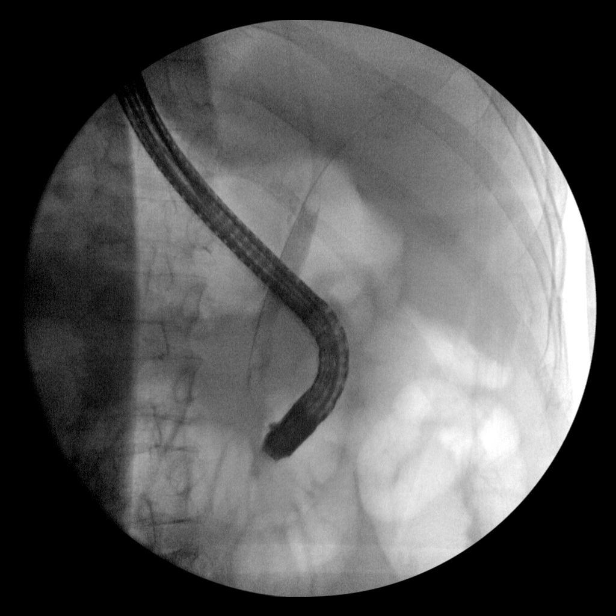
[im 3/8]
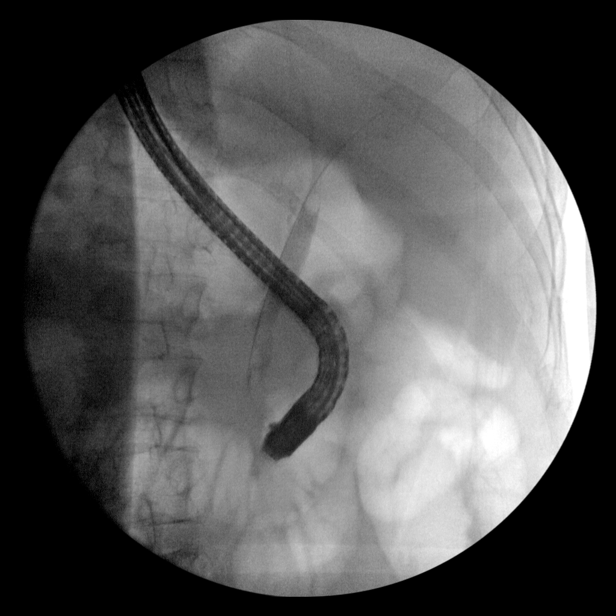
[im 4/8]
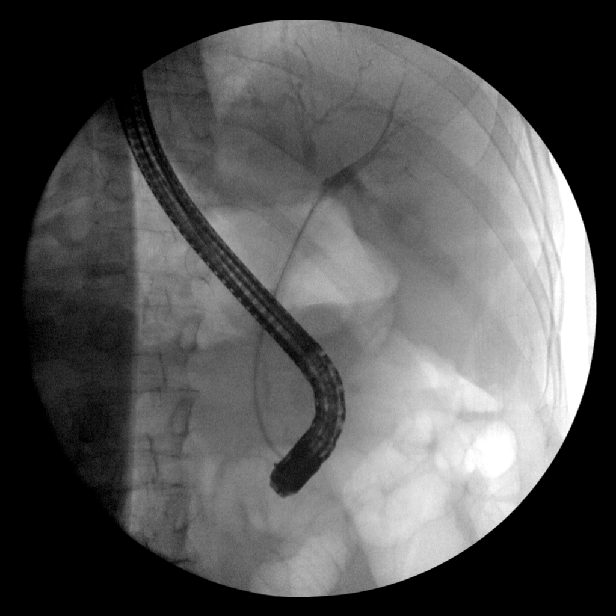
[im 4/8]
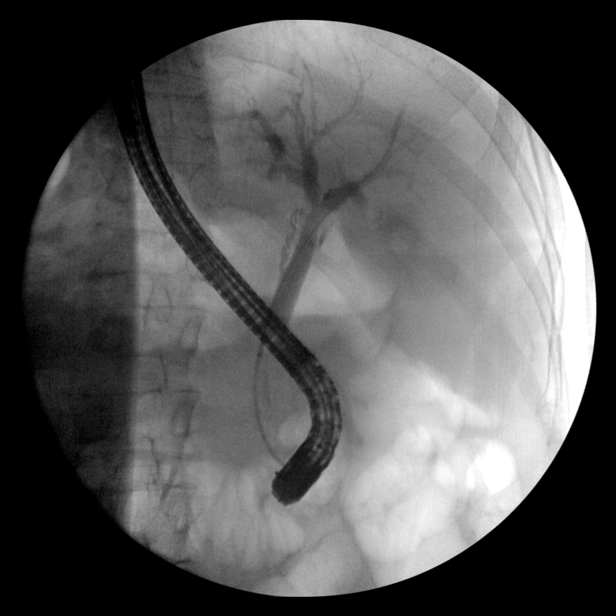
[im 4/8]
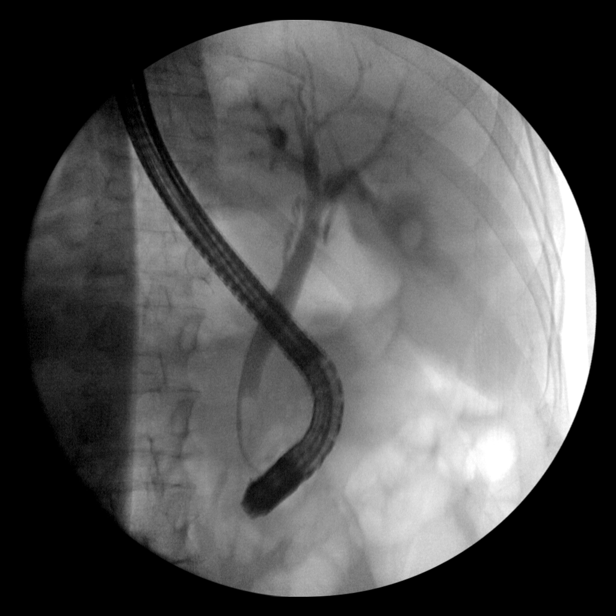
[im 4/8]
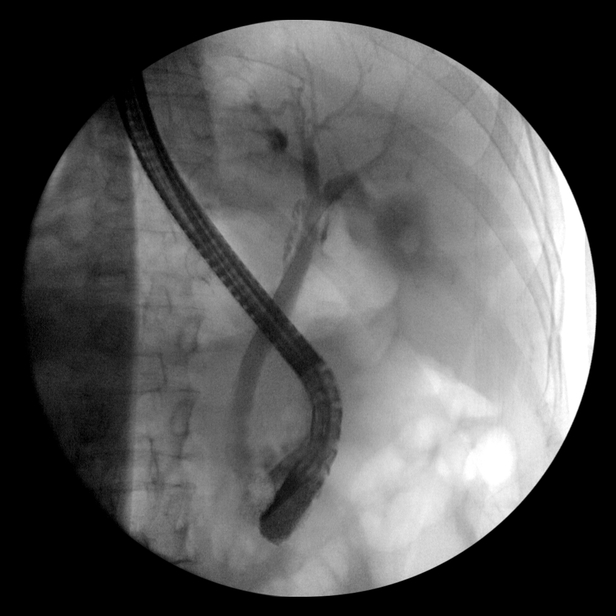
[im 5/8]
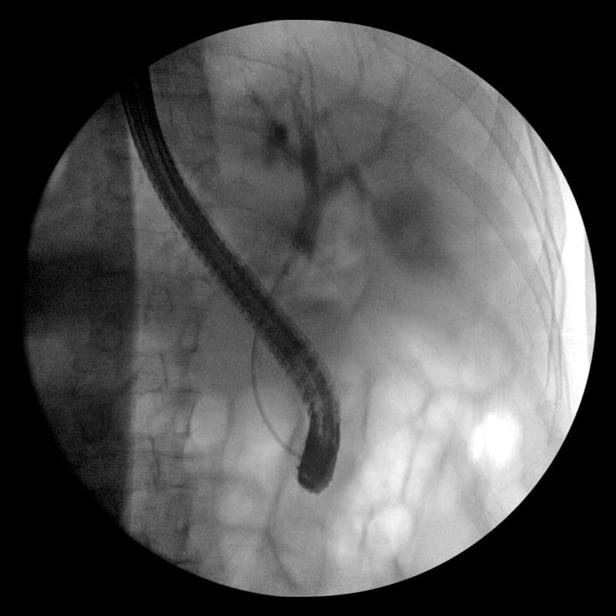
[im 6/8]
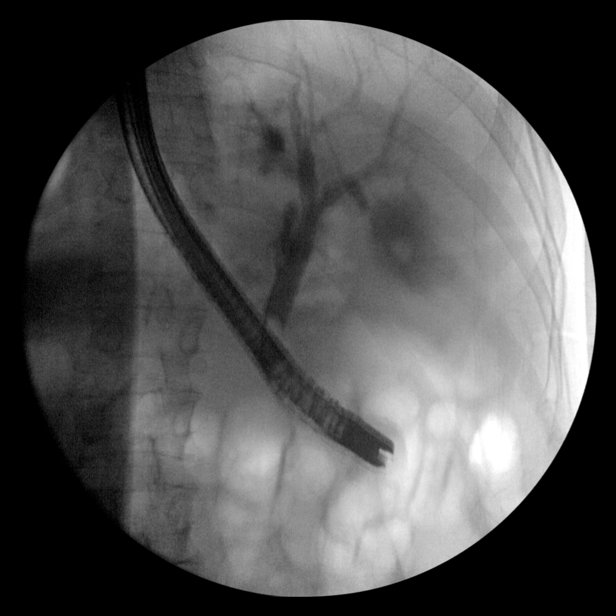
[im 7/8]
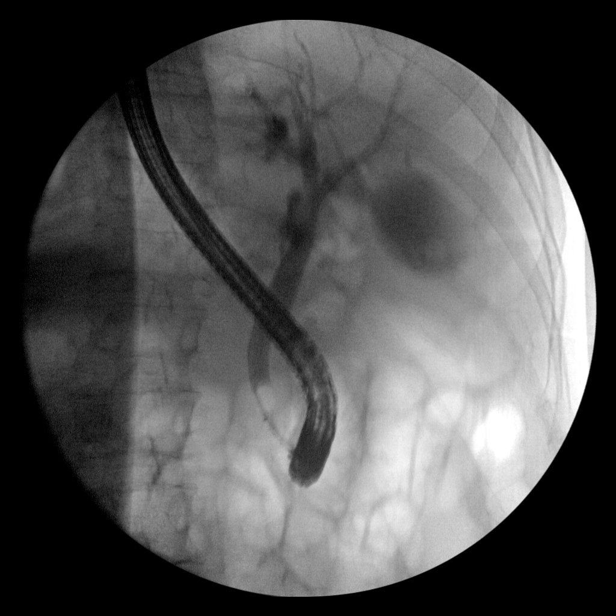
[im 8/8]
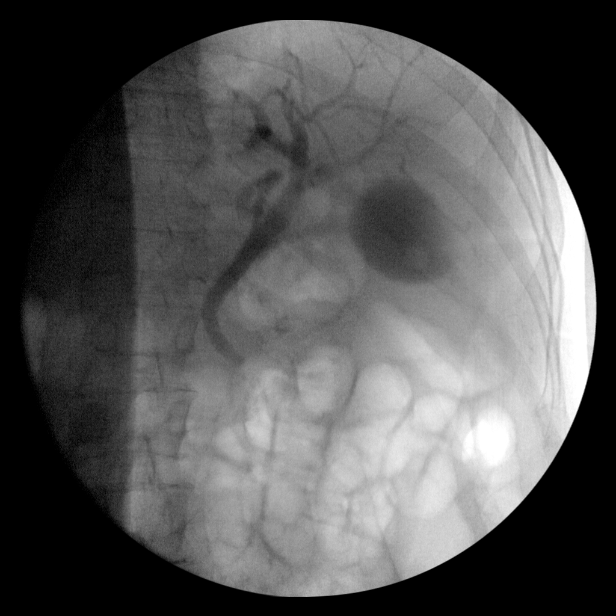

[11 of 11 positions shown; findings below may reference images not displayed]

FINDINGS: Imaging with a C-arm demonstrates cannulation of the common bile
duct with initial contrast injection demonstrating at least 1
filling defect in the common bile duct. The common bile duct is
mildly dilated. Balloon sweep maneuver was performed to extract
material from the common bile duct. Opacification of the gallbladder
during the procedure with at least 1 filling defect within the
gallbladder lumen.
IMPRESSION: Imaging confirms choledocholithiasis in the common bile duct.
Balloon sweep maneuver was performed to extract material. At least 1
gallstone is visualized in the gallbladder as a filling defect.

These images were submitted for radiologic interpretation only.
Please see the procedural report for the amount of contrast and the
fluoroscopy time utilized.

## 2022-11-01 DIAGNOSIS — M545 Low back pain, unspecified: Secondary | ICD-10-CM | POA: Diagnosis not present

## 2022-11-01 DIAGNOSIS — Z299 Encounter for prophylactic measures, unspecified: Secondary | ICD-10-CM | POA: Diagnosis not present

## 2022-11-01 DIAGNOSIS — F1120 Opioid dependence, uncomplicated: Secondary | ICD-10-CM | POA: Diagnosis not present

## 2022-11-01 DIAGNOSIS — I10 Essential (primary) hypertension: Secondary | ICD-10-CM | POA: Diagnosis not present

## 2022-11-01 DIAGNOSIS — Z79899 Other long term (current) drug therapy: Secondary | ICD-10-CM | POA: Diagnosis not present

## 2022-11-01 DIAGNOSIS — J449 Chronic obstructive pulmonary disease, unspecified: Secondary | ICD-10-CM | POA: Diagnosis not present

## 2022-11-30 DIAGNOSIS — J449 Chronic obstructive pulmonary disease, unspecified: Secondary | ICD-10-CM | POA: Diagnosis not present

## 2022-11-30 DIAGNOSIS — I10 Essential (primary) hypertension: Secondary | ICD-10-CM | POA: Diagnosis not present

## 2022-11-30 DIAGNOSIS — I700 Atherosclerosis of aorta: Secondary | ICD-10-CM | POA: Diagnosis not present

## 2022-11-30 DIAGNOSIS — M545 Low back pain, unspecified: Secondary | ICD-10-CM | POA: Diagnosis not present

## 2022-12-28 DIAGNOSIS — Z961 Presence of intraocular lens: Secondary | ICD-10-CM | POA: Diagnosis not present

## 2022-12-30 DIAGNOSIS — Z79899 Other long term (current) drug therapy: Secondary | ICD-10-CM | POA: Diagnosis not present

## 2022-12-30 DIAGNOSIS — M545 Low back pain, unspecified: Secondary | ICD-10-CM | POA: Diagnosis not present

## 2022-12-30 DIAGNOSIS — F1120 Opioid dependence, uncomplicated: Secondary | ICD-10-CM | POA: Diagnosis not present

## 2022-12-30 DIAGNOSIS — F411 Generalized anxiety disorder: Secondary | ICD-10-CM | POA: Diagnosis not present

## 2022-12-30 DIAGNOSIS — I700 Atherosclerosis of aorta: Secondary | ICD-10-CM | POA: Diagnosis not present

## 2023-01-27 DIAGNOSIS — E7800 Pure hypercholesterolemia, unspecified: Secondary | ICD-10-CM | POA: Diagnosis not present

## 2023-01-27 DIAGNOSIS — F1120 Opioid dependence, uncomplicated: Secondary | ICD-10-CM | POA: Diagnosis not present

## 2023-01-27 DIAGNOSIS — Z79899 Other long term (current) drug therapy: Secondary | ICD-10-CM | POA: Diagnosis not present

## 2023-01-27 DIAGNOSIS — M545 Low back pain, unspecified: Secondary | ICD-10-CM | POA: Diagnosis not present

## 2023-01-27 DIAGNOSIS — J449 Chronic obstructive pulmonary disease, unspecified: Secondary | ICD-10-CM | POA: Diagnosis not present

## 2023-02-27 DIAGNOSIS — M545 Low back pain, unspecified: Secondary | ICD-10-CM | POA: Diagnosis not present

## 2023-02-27 DIAGNOSIS — Z79899 Other long term (current) drug therapy: Secondary | ICD-10-CM | POA: Diagnosis not present

## 2023-02-27 DIAGNOSIS — F1120 Opioid dependence, uncomplicated: Secondary | ICD-10-CM | POA: Diagnosis not present

## 2023-02-27 DIAGNOSIS — J449 Chronic obstructive pulmonary disease, unspecified: Secondary | ICD-10-CM | POA: Diagnosis not present

## 2023-03-29 DIAGNOSIS — E7800 Pure hypercholesterolemia, unspecified: Secondary | ICD-10-CM | POA: Diagnosis not present

## 2023-03-29 DIAGNOSIS — F1120 Opioid dependence, uncomplicated: Secondary | ICD-10-CM | POA: Diagnosis not present

## 2023-03-29 DIAGNOSIS — M545 Low back pain, unspecified: Secondary | ICD-10-CM | POA: Diagnosis not present

## 2023-03-29 DIAGNOSIS — Z79899 Other long term (current) drug therapy: Secondary | ICD-10-CM | POA: Diagnosis not present

## 2023-03-29 DIAGNOSIS — F411 Generalized anxiety disorder: Secondary | ICD-10-CM | POA: Diagnosis not present

## 2023-04-28 DIAGNOSIS — M545 Low back pain, unspecified: Secondary | ICD-10-CM | POA: Diagnosis not present

## 2023-04-28 DIAGNOSIS — Z79899 Other long term (current) drug therapy: Secondary | ICD-10-CM | POA: Diagnosis not present

## 2023-04-28 DIAGNOSIS — F1120 Opioid dependence, uncomplicated: Secondary | ICD-10-CM | POA: Diagnosis not present

## 2023-04-28 DIAGNOSIS — J449 Chronic obstructive pulmonary disease, unspecified: Secondary | ICD-10-CM | POA: Diagnosis not present

## 2023-04-28 DIAGNOSIS — I700 Atherosclerosis of aorta: Secondary | ICD-10-CM | POA: Diagnosis not present

## 2023-05-29 DIAGNOSIS — R251 Tremor, unspecified: Secondary | ICD-10-CM | POA: Diagnosis not present

## 2023-05-29 DIAGNOSIS — F1120 Opioid dependence, uncomplicated: Secondary | ICD-10-CM | POA: Diagnosis not present

## 2023-05-29 DIAGNOSIS — M545 Low back pain, unspecified: Secondary | ICD-10-CM | POA: Diagnosis not present

## 2023-05-29 DIAGNOSIS — I10 Essential (primary) hypertension: Secondary | ICD-10-CM | POA: Diagnosis not present

## 2023-05-29 DIAGNOSIS — I700 Atherosclerosis of aorta: Secondary | ICD-10-CM | POA: Diagnosis not present

## 2023-06-29 DIAGNOSIS — F411 Generalized anxiety disorder: Secondary | ICD-10-CM | POA: Diagnosis not present

## 2023-06-29 DIAGNOSIS — J45909 Unspecified asthma, uncomplicated: Secondary | ICD-10-CM | POA: Diagnosis not present

## 2023-06-29 DIAGNOSIS — F1120 Opioid dependence, uncomplicated: Secondary | ICD-10-CM | POA: Diagnosis not present

## 2023-06-29 DIAGNOSIS — M545 Low back pain, unspecified: Secondary | ICD-10-CM | POA: Diagnosis not present

## 2023-06-29 DIAGNOSIS — Z79899 Other long term (current) drug therapy: Secondary | ICD-10-CM | POA: Diagnosis not present

## 2023-07-28 DIAGNOSIS — M545 Low back pain, unspecified: Secondary | ICD-10-CM | POA: Diagnosis not present

## 2023-07-28 DIAGNOSIS — F1120 Opioid dependence, uncomplicated: Secondary | ICD-10-CM | POA: Diagnosis not present

## 2023-07-28 DIAGNOSIS — Z79899 Other long term (current) drug therapy: Secondary | ICD-10-CM | POA: Diagnosis not present

## 2023-07-28 DIAGNOSIS — R52 Pain, unspecified: Secondary | ICD-10-CM | POA: Diagnosis not present

## 2023-07-28 DIAGNOSIS — R251 Tremor, unspecified: Secondary | ICD-10-CM | POA: Diagnosis not present

## 2023-07-28 DIAGNOSIS — J449 Chronic obstructive pulmonary disease, unspecified: Secondary | ICD-10-CM | POA: Diagnosis not present

## 2023-08-09 DIAGNOSIS — J309 Allergic rhinitis, unspecified: Secondary | ICD-10-CM | POA: Diagnosis not present

## 2023-08-09 DIAGNOSIS — R0981 Nasal congestion: Secondary | ICD-10-CM | POA: Diagnosis not present

## 2023-08-09 DIAGNOSIS — Z20822 Contact with and (suspected) exposure to covid-19: Secondary | ICD-10-CM | POA: Diagnosis not present

## 2023-08-09 DIAGNOSIS — Z299 Encounter for prophylactic measures, unspecified: Secondary | ICD-10-CM | POA: Diagnosis not present

## 2023-08-09 DIAGNOSIS — R197 Diarrhea, unspecified: Secondary | ICD-10-CM | POA: Diagnosis not present

## 2023-08-09 DIAGNOSIS — J449 Chronic obstructive pulmonary disease, unspecified: Secondary | ICD-10-CM | POA: Diagnosis not present

## 2023-08-29 DIAGNOSIS — M545 Low back pain, unspecified: Secondary | ICD-10-CM | POA: Diagnosis not present

## 2023-08-29 DIAGNOSIS — Z79899 Other long term (current) drug therapy: Secondary | ICD-10-CM | POA: Diagnosis not present

## 2023-08-29 DIAGNOSIS — F1120 Opioid dependence, uncomplicated: Secondary | ICD-10-CM | POA: Diagnosis not present

## 2023-08-29 DIAGNOSIS — J449 Chronic obstructive pulmonary disease, unspecified: Secondary | ICD-10-CM | POA: Diagnosis not present

## 2023-08-29 DIAGNOSIS — I700 Atherosclerosis of aorta: Secondary | ICD-10-CM | POA: Diagnosis not present

## 2023-09-28 DIAGNOSIS — J449 Chronic obstructive pulmonary disease, unspecified: Secondary | ICD-10-CM | POA: Diagnosis not present

## 2023-09-28 DIAGNOSIS — Z79899 Other long term (current) drug therapy: Secondary | ICD-10-CM | POA: Diagnosis not present

## 2023-09-28 DIAGNOSIS — F1120 Opioid dependence, uncomplicated: Secondary | ICD-10-CM | POA: Diagnosis not present

## 2023-09-28 DIAGNOSIS — M545 Low back pain, unspecified: Secondary | ICD-10-CM | POA: Diagnosis not present

## 2023-09-28 DIAGNOSIS — F411 Generalized anxiety disorder: Secondary | ICD-10-CM | POA: Diagnosis not present

## 2023-10-24 DIAGNOSIS — R197 Diarrhea, unspecified: Secondary | ICD-10-CM | POA: Diagnosis not present

## 2023-10-24 DIAGNOSIS — M545 Low back pain, unspecified: Secondary | ICD-10-CM | POA: Diagnosis not present

## 2023-11-27 DIAGNOSIS — K6289 Other specified diseases of anus and rectum: Secondary | ICD-10-CM | POA: Diagnosis not present

## 2023-11-27 DIAGNOSIS — M545 Low back pain, unspecified: Secondary | ICD-10-CM | POA: Diagnosis not present

## 2023-11-27 DIAGNOSIS — K602 Anal fissure, unspecified: Secondary | ICD-10-CM | POA: Diagnosis not present

## 2023-12-26 DIAGNOSIS — Z79899 Other long term (current) drug therapy: Secondary | ICD-10-CM | POA: Diagnosis not present

## 2023-12-26 DIAGNOSIS — F411 Generalized anxiety disorder: Secondary | ICD-10-CM | POA: Diagnosis not present

## 2023-12-26 DIAGNOSIS — M1990 Unspecified osteoarthritis, unspecified site: Secondary | ICD-10-CM | POA: Diagnosis not present

## 2023-12-26 DIAGNOSIS — I10 Essential (primary) hypertension: Secondary | ICD-10-CM | POA: Diagnosis not present

## 2024-01-24 DIAGNOSIS — J449 Chronic obstructive pulmonary disease, unspecified: Secondary | ICD-10-CM | POA: Diagnosis not present

## 2024-01-24 DIAGNOSIS — Z299 Encounter for prophylactic measures, unspecified: Secondary | ICD-10-CM | POA: Diagnosis not present

## 2024-01-24 DIAGNOSIS — M545 Low back pain, unspecified: Secondary | ICD-10-CM | POA: Diagnosis not present

## 2024-01-24 DIAGNOSIS — I700 Atherosclerosis of aorta: Secondary | ICD-10-CM | POA: Diagnosis not present

## 2024-01-24 DIAGNOSIS — F1120 Opioid dependence, uncomplicated: Secondary | ICD-10-CM | POA: Diagnosis not present

## 2024-01-24 DIAGNOSIS — I10 Essential (primary) hypertension: Secondary | ICD-10-CM | POA: Diagnosis not present

## 2024-02-02 ENCOUNTER — Encounter: Payer: Self-pay | Admitting: *Deleted

## 2024-02-19 DIAGNOSIS — M545 Low back pain, unspecified: Secondary | ICD-10-CM | POA: Diagnosis not present

## 2024-02-19 DIAGNOSIS — Z79899 Other long term (current) drug therapy: Secondary | ICD-10-CM | POA: Diagnosis not present

## 2024-02-19 DIAGNOSIS — F17210 Nicotine dependence, cigarettes, uncomplicated: Secondary | ICD-10-CM | POA: Diagnosis not present

## 2024-02-19 DIAGNOSIS — I10 Essential (primary) hypertension: Secondary | ICD-10-CM | POA: Diagnosis not present

## 2024-02-19 DIAGNOSIS — R5383 Other fatigue: Secondary | ICD-10-CM | POA: Diagnosis not present

## 2024-02-19 DIAGNOSIS — Z299 Encounter for prophylactic measures, unspecified: Secondary | ICD-10-CM | POA: Diagnosis not present

## 2024-03-19 DIAGNOSIS — F411 Generalized anxiety disorder: Secondary | ICD-10-CM | POA: Diagnosis not present

## 2024-03-19 DIAGNOSIS — M545 Low back pain, unspecified: Secondary | ICD-10-CM | POA: Diagnosis not present

## 2024-03-19 DIAGNOSIS — Z299 Encounter for prophylactic measures, unspecified: Secondary | ICD-10-CM | POA: Diagnosis not present

## 2024-03-19 DIAGNOSIS — Z79899 Other long term (current) drug therapy: Secondary | ICD-10-CM | POA: Diagnosis not present

## 2024-03-19 DIAGNOSIS — I10 Essential (primary) hypertension: Secondary | ICD-10-CM | POA: Diagnosis not present

## 2024-04-16 DIAGNOSIS — I10 Essential (primary) hypertension: Secondary | ICD-10-CM | POA: Diagnosis not present

## 2024-04-16 DIAGNOSIS — F411 Generalized anxiety disorder: Secondary | ICD-10-CM | POA: Diagnosis not present

## 2024-04-16 DIAGNOSIS — M545 Low back pain, unspecified: Secondary | ICD-10-CM | POA: Diagnosis not present

## 2024-04-16 DIAGNOSIS — Z79899 Other long term (current) drug therapy: Secondary | ICD-10-CM | POA: Diagnosis not present

## 2024-05-17 DIAGNOSIS — R52 Pain, unspecified: Secondary | ICD-10-CM | POA: Diagnosis not present

## 2024-05-17 DIAGNOSIS — M545 Low back pain, unspecified: Secondary | ICD-10-CM | POA: Diagnosis not present

## 2024-05-17 DIAGNOSIS — F1120 Opioid dependence, uncomplicated: Secondary | ICD-10-CM | POA: Diagnosis not present

## 2024-05-17 DIAGNOSIS — I10 Essential (primary) hypertension: Secondary | ICD-10-CM | POA: Diagnosis not present

## 2024-06-13 DIAGNOSIS — M545 Low back pain, unspecified: Secondary | ICD-10-CM | POA: Diagnosis not present

## 2024-06-13 DIAGNOSIS — N400 Enlarged prostate without lower urinary tract symptoms: Secondary | ICD-10-CM | POA: Diagnosis not present

## 2024-06-13 DIAGNOSIS — I10 Essential (primary) hypertension: Secondary | ICD-10-CM | POA: Diagnosis not present

## 2024-06-13 DIAGNOSIS — J449 Chronic obstructive pulmonary disease, unspecified: Secondary | ICD-10-CM | POA: Diagnosis not present

## 2024-06-13 DIAGNOSIS — F411 Generalized anxiety disorder: Secondary | ICD-10-CM | POA: Diagnosis not present

## 2024-06-13 DIAGNOSIS — Z299 Encounter for prophylactic measures, unspecified: Secondary | ICD-10-CM | POA: Diagnosis not present

## 2024-06-13 DIAGNOSIS — Z79899 Other long term (current) drug therapy: Secondary | ICD-10-CM | POA: Diagnosis not present

## 2024-07-01 DIAGNOSIS — Z043 Encounter for examination and observation following other accident: Secondary | ICD-10-CM | POA: Diagnosis not present

## 2024-07-01 DIAGNOSIS — M1712 Unilateral primary osteoarthritis, left knee: Secondary | ICD-10-CM | POA: Diagnosis not present

## 2024-07-01 DIAGNOSIS — M25462 Effusion, left knee: Secondary | ICD-10-CM | POA: Diagnosis not present

## 2024-07-01 DIAGNOSIS — I10 Essential (primary) hypertension: Secondary | ICD-10-CM | POA: Diagnosis not present

## 2024-07-01 DIAGNOSIS — Z79899 Other long term (current) drug therapy: Secondary | ICD-10-CM | POA: Diagnosis not present

## 2024-07-01 DIAGNOSIS — J449 Chronic obstructive pulmonary disease, unspecified: Secondary | ICD-10-CM | POA: Diagnosis not present

## 2024-07-01 DIAGNOSIS — S8992XA Unspecified injury of left lower leg, initial encounter: Secondary | ICD-10-CM | POA: Diagnosis not present

## 2024-07-01 DIAGNOSIS — E785 Hyperlipidemia, unspecified: Secondary | ICD-10-CM | POA: Diagnosis not present

## 2024-07-01 DIAGNOSIS — M25562 Pain in left knee: Secondary | ICD-10-CM | POA: Diagnosis not present

## 2024-07-01 DIAGNOSIS — Z880 Allergy status to penicillin: Secondary | ICD-10-CM | POA: Diagnosis not present

## 2024-07-01 DIAGNOSIS — Z87891 Personal history of nicotine dependence: Secondary | ICD-10-CM | POA: Diagnosis not present

## 2024-07-01 DIAGNOSIS — S80912A Unspecified superficial injury of left knee, initial encounter: Secondary | ICD-10-CM | POA: Diagnosis not present

## 2024-07-12 DIAGNOSIS — Z79899 Other long term (current) drug therapy: Secondary | ICD-10-CM | POA: Diagnosis not present

## 2024-07-12 DIAGNOSIS — M545 Low back pain, unspecified: Secondary | ICD-10-CM | POA: Diagnosis not present

## 2024-07-12 DIAGNOSIS — R0602 Shortness of breath: Secondary | ICD-10-CM | POA: Diagnosis not present

## 2024-07-12 DIAGNOSIS — R251 Tremor, unspecified: Secondary | ICD-10-CM | POA: Diagnosis not present

## 2024-07-12 DIAGNOSIS — I10 Essential (primary) hypertension: Secondary | ICD-10-CM | POA: Diagnosis not present

## 2024-07-15 DIAGNOSIS — M1712 Unilateral primary osteoarthritis, left knee: Secondary | ICD-10-CM | POA: Diagnosis not present

## 2024-07-15 DIAGNOSIS — S62616A Displaced fracture of proximal phalanx of right little finger, initial encounter for closed fracture: Secondary | ICD-10-CM | POA: Diagnosis not present

## 2024-07-17 DIAGNOSIS — S62616A Displaced fracture of proximal phalanx of right little finger, initial encounter for closed fracture: Secondary | ICD-10-CM | POA: Diagnosis not present

## 2024-08-13 DIAGNOSIS — M545 Low back pain, unspecified: Secondary | ICD-10-CM | POA: Diagnosis not present

## 2024-08-13 DIAGNOSIS — R52 Pain, unspecified: Secondary | ICD-10-CM | POA: Diagnosis not present

## 2024-08-13 DIAGNOSIS — Z1339 Encounter for screening examination for other mental health and behavioral disorders: Secondary | ICD-10-CM | POA: Diagnosis not present

## 2024-08-13 DIAGNOSIS — E7800 Pure hypercholesterolemia, unspecified: Secondary | ICD-10-CM | POA: Diagnosis not present

## 2024-08-13 DIAGNOSIS — Z79899 Other long term (current) drug therapy: Secondary | ICD-10-CM | POA: Diagnosis not present

## 2024-08-13 DIAGNOSIS — Z7189 Other specified counseling: Secondary | ICD-10-CM | POA: Diagnosis not present

## 2024-08-13 DIAGNOSIS — Z1331 Encounter for screening for depression: Secondary | ICD-10-CM | POA: Diagnosis not present

## 2024-08-13 DIAGNOSIS — F1120 Opioid dependence, uncomplicated: Secondary | ICD-10-CM | POA: Diagnosis not present

## 2024-08-13 DIAGNOSIS — J449 Chronic obstructive pulmonary disease, unspecified: Secondary | ICD-10-CM | POA: Diagnosis not present

## 2024-08-13 DIAGNOSIS — Z0000 Encounter for general adult medical examination without abnormal findings: Secondary | ICD-10-CM | POA: Diagnosis not present

## 2024-08-13 DIAGNOSIS — R5383 Other fatigue: Secondary | ICD-10-CM | POA: Diagnosis not present

## 2024-08-13 DIAGNOSIS — I10 Essential (primary) hypertension: Secondary | ICD-10-CM | POA: Diagnosis not present

## 2024-08-13 DIAGNOSIS — Z125 Encounter for screening for malignant neoplasm of prostate: Secondary | ICD-10-CM | POA: Diagnosis not present

## 2024-09-10 DIAGNOSIS — I10 Essential (primary) hypertension: Secondary | ICD-10-CM | POA: Diagnosis not present

## 2024-09-10 DIAGNOSIS — M545 Low back pain, unspecified: Secondary | ICD-10-CM | POA: Diagnosis not present

## 2024-09-10 DIAGNOSIS — F1120 Opioid dependence, uncomplicated: Secondary | ICD-10-CM | POA: Diagnosis not present

## 2024-09-10 DIAGNOSIS — R52 Pain, unspecified: Secondary | ICD-10-CM | POA: Diagnosis not present

## 2024-09-10 DIAGNOSIS — Z299 Encounter for prophylactic measures, unspecified: Secondary | ICD-10-CM | POA: Diagnosis not present

## 2024-09-10 DIAGNOSIS — J449 Chronic obstructive pulmonary disease, unspecified: Secondary | ICD-10-CM | POA: Diagnosis not present

## 2024-09-11 DIAGNOSIS — I739 Peripheral vascular disease, unspecified: Secondary | ICD-10-CM | POA: Diagnosis not present

## 2024-09-11 DIAGNOSIS — G629 Polyneuropathy, unspecified: Secondary | ICD-10-CM | POA: Diagnosis not present

## 2024-09-11 DIAGNOSIS — G4700 Insomnia, unspecified: Secondary | ICD-10-CM | POA: Diagnosis not present

## 2024-09-11 DIAGNOSIS — N400 Enlarged prostate without lower urinary tract symptoms: Secondary | ICD-10-CM | POA: Diagnosis not present

## 2024-09-11 DIAGNOSIS — M545 Low back pain, unspecified: Secondary | ICD-10-CM | POA: Diagnosis not present

## 2024-09-11 DIAGNOSIS — E785 Hyperlipidemia, unspecified: Secondary | ICD-10-CM | POA: Diagnosis not present

## 2024-09-11 DIAGNOSIS — K219 Gastro-esophageal reflux disease without esophagitis: Secondary | ICD-10-CM | POA: Diagnosis not present

## 2024-09-11 DIAGNOSIS — I10 Essential (primary) hypertension: Secondary | ICD-10-CM | POA: Diagnosis not present

## 2024-09-11 DIAGNOSIS — F419 Anxiety disorder, unspecified: Secondary | ICD-10-CM | POA: Diagnosis not present

## 2024-10-11 DIAGNOSIS — R42 Dizziness and giddiness: Secondary | ICD-10-CM | POA: Diagnosis not present

## 2024-10-11 DIAGNOSIS — J449 Chronic obstructive pulmonary disease, unspecified: Secondary | ICD-10-CM | POA: Diagnosis not present

## 2024-10-11 DIAGNOSIS — F1120 Opioid dependence, uncomplicated: Secondary | ICD-10-CM | POA: Diagnosis not present

## 2024-10-11 DIAGNOSIS — Z299 Encounter for prophylactic measures, unspecified: Secondary | ICD-10-CM | POA: Diagnosis not present

## 2024-10-11 DIAGNOSIS — M545 Low back pain, unspecified: Secondary | ICD-10-CM | POA: Diagnosis not present

## 2024-10-11 DIAGNOSIS — R52 Pain, unspecified: Secondary | ICD-10-CM | POA: Diagnosis not present
